# Patient Record
Sex: Female | Born: 1975 | Race: Black or African American | Hispanic: No | Marital: Married | Smoking: Never smoker
Health system: Southern US, Community
[De-identification: ages and names within clinical notes are randomized; demographics above are authoritative.]

## PROBLEM LIST (undated history)

## (undated) DIAGNOSIS — D649 Anemia, unspecified: Secondary | ICD-10-CM

## (undated) DIAGNOSIS — K219 Gastro-esophageal reflux disease without esophagitis: Secondary | ICD-10-CM

## (undated) DIAGNOSIS — F32A Depression, unspecified: Secondary | ICD-10-CM

## (undated) DIAGNOSIS — F4310 Post-traumatic stress disorder, unspecified: Secondary | ICD-10-CM

## (undated) DIAGNOSIS — F329 Major depressive disorder, single episode, unspecified: Secondary | ICD-10-CM

## (undated) DIAGNOSIS — N83202 Unspecified ovarian cyst, left side: Secondary | ICD-10-CM

## (undated) HISTORY — DX: Depression, unspecified: F32.A

## (undated) HISTORY — PX: TUBAL LIGATION: SHX77

## (undated) HISTORY — DX: Major depressive disorder, single episode, unspecified: F32.9

## (undated) HISTORY — DX: Anemia, unspecified: D64.9

---

## 1998-04-23 ENCOUNTER — Other Ambulatory Visit: Admission: RE | Admit: 1998-04-23 | Discharge: 1998-04-23 | Payer: Self-pay | Admitting: Obstetrics and Gynecology

## 1998-09-25 ENCOUNTER — Emergency Department (HOSPITAL_COMMUNITY): Admission: EM | Admit: 1998-09-25 | Discharge: 1998-09-25 | Payer: Self-pay | Admitting: Emergency Medicine

## 1999-03-01 ENCOUNTER — Emergency Department (HOSPITAL_COMMUNITY): Admission: EM | Admit: 1999-03-01 | Discharge: 1999-03-01 | Payer: Self-pay | Admitting: Internal Medicine

## 1999-03-01 ENCOUNTER — Encounter: Payer: Self-pay | Admitting: Internal Medicine

## 1999-03-01 ENCOUNTER — Encounter: Payer: Self-pay | Admitting: Emergency Medicine

## 2000-05-11 ENCOUNTER — Emergency Department (HOSPITAL_COMMUNITY): Admission: EM | Admit: 2000-05-11 | Discharge: 2000-05-11 | Payer: Self-pay | Admitting: Emergency Medicine

## 2000-05-23 ENCOUNTER — Emergency Department (HOSPITAL_COMMUNITY): Admission: EM | Admit: 2000-05-23 | Discharge: 2000-05-23 | Payer: Self-pay | Admitting: *Deleted

## 2000-06-12 ENCOUNTER — Other Ambulatory Visit: Admission: RE | Admit: 2000-06-12 | Discharge: 2000-06-12 | Payer: Self-pay | Admitting: Internal Medicine

## 2006-08-16 ENCOUNTER — Inpatient Hospital Stay (HOSPITAL_COMMUNITY): Admission: AD | Admit: 2006-08-16 | Discharge: 2006-08-16 | Payer: Self-pay | Admitting: Gynecology

## 2006-08-23 ENCOUNTER — Inpatient Hospital Stay (HOSPITAL_COMMUNITY): Admission: AD | Admit: 2006-08-23 | Discharge: 2006-08-23 | Payer: Self-pay | Admitting: Gynecology

## 2006-08-30 ENCOUNTER — Inpatient Hospital Stay (HOSPITAL_COMMUNITY): Admission: AD | Admit: 2006-08-30 | Discharge: 2006-08-30 | Payer: Self-pay | Admitting: Gynecology

## 2006-09-06 ENCOUNTER — Inpatient Hospital Stay (HOSPITAL_COMMUNITY): Admission: AD | Admit: 2006-09-06 | Discharge: 2006-09-06 | Payer: Self-pay | Admitting: Gynecology

## 2007-11-15 HISTORY — PX: TUBAL LIGATION: SHX77

## 2008-05-15 ENCOUNTER — Other Ambulatory Visit: Admission: RE | Admit: 2008-05-15 | Discharge: 2008-05-15 | Payer: Self-pay | Admitting: Obstetrics & Gynecology

## 2008-07-28 ENCOUNTER — Ambulatory Visit (HOSPITAL_BASED_OUTPATIENT_CLINIC_OR_DEPARTMENT_OTHER): Admission: RE | Admit: 2008-07-28 | Discharge: 2008-07-28 | Payer: Self-pay | Admitting: Obstetrics and Gynecology

## 2009-11-18 ENCOUNTER — Emergency Department (HOSPITAL_COMMUNITY): Admission: EM | Admit: 2009-11-18 | Discharge: 2009-11-18 | Payer: Self-pay | Admitting: Emergency Medicine

## 2010-09-16 ENCOUNTER — Ambulatory Visit: Payer: Self-pay | Admitting: Internal Medicine

## 2010-09-30 ENCOUNTER — Ambulatory Visit: Payer: Self-pay | Admitting: Internal Medicine

## 2011-03-29 NOTE — Op Note (Signed)
Kayla Roman, Kayla Roman                ACCOUNT NO.:  [id number]   MEDICAL RECORD NO.:  [id number]          PATIENT TYPE:  AMB   LOCATION:  [facility]                         FACILITY:  [facility]   PHYSICIAN:  Cynthia P. Romine, M.D.DATE OF BIRTH:  01-14-1976   DATE OF PROCEDURE:  07/28/2008  DATE OF DISCHARGE:                               OPERATIVE REPORT   PREOPERATIVE DIAGNOSIS:  Desire for attempt at permanent surgical  sterilization.   POSTOPERATIVE DIAGNOSIS:  Desire for attempt at permanent surgical  sterilization.   PROCEDURE:  Laparoscopic bipolar cautery tubal sterilization procedure  after abandoning the planned Falope ring procedure.   SURGEON:  Cynthia P. Romine, M.D.   ANESTHESIA:  General endotracheal.   ESTIMATED BLOOD LOSS:  Minimal.   COMPLICATIONS:  None.   PROCEDURE:  The patient was taken to the operating room and after the  induction of adequate general endotracheal anesthesia, was placed in the  dorsal lithotomy position and prepped and draped in the usual fashion.  A sterile speculum was inserted into the vagina and a Hulka uterine  manipulator was placed.  The bladder was drained with a red rubber  catheter.  At this point it was pointed out to the surgeon that the  Falope ring applier had been sterilized with the 2 pieces that normally  slide apart together so that they were stuck together and it was  impossible to properly load into the applier apparatus.  There was not a  disposable applier or a non disposable applier in the main OR at [facility], therefore, a runner was sent to [facility] to get their  applier.  While the runner was getting the applier from [facility], the  surgeon proceeded with the laparoscopy.  The subumbilical area was  injected with 0.5% Marcaine with epinephrine and a vertical incision was  made in the fold of the umbilicus.  The Veress needle was inserted in  the peritoneal space.  Proper placement was tested by noting a negative  aspirate and free flow of saline through the Veress needle, again, with  negative aspirate and then by noting the response of a drop of saline  placed at the hub of the Veress needle.  Negative pressure at the  abdominal wall was elevated.  Pneumoperitoneum was then created with 2  liters of CO2 using the automatic insufflator.  A disposable 11-mm  trocar was then inserted into the peritoneal space and its proper  placement noted with the laparoscope.  The pelvis was inspected.  The  uterus contained fibroids as was known preoperatively.  The tubes and  ovaries appeared normal.  The appendix appeared normal.  The upper  abdomen was visualized and also appeared normal.  At this point the  applier had been received from [facility], however, even though it  worked properly, it was already put together when the pack was opened  and therefore it was assumed that it had been sterilized put together  rather than sterilized in pieces and therefore the apparatus was felt to  be sterilized improperly and was therefore not used.  Therefore, in  order to do the tubal sterilization procedure, bipolar cautery was used.  A segment of tube of at least 4 cm was cauterized with bipolar on each  side, identifying the tube and tracing it to its fimbriated end as the  bipolar cautery was done.  Photographic documentation was taken of the  cauterized sites after surgery.  The instruments were removed from the  abdomen and pneumoperitoneum was allowed to escape.  The trocar sleeves  were removed.  The incisions were closed subcuticularly with 3-0 Vicryl Rapide.  The  instruments were removed from the vagina and the procedure was  terminated.  The patient tolerated it well and went in satisfactory  condition to post anesthesia recovery.      Cynthia P. Romine, M.D.  Electronically Signed     CPR/MEDQ  D:  07/28/2008  T:  07/29/2008  Job:  [id number]

## 2011-08-17 LAB — CBC
HCT: 38.3
Hemoglobin: 12.6
MCHC: 32.9
MCV: 87.3
Platelets: 258
RBC: 4.38
RDW: 13.3
WBC: 10.4

## 2011-08-17 LAB — HCG, SERUM, QUALITATIVE: Preg, Serum: NEGATIVE

## 2012-03-07 DIAGNOSIS — Z0000 Encounter for general adult medical examination without abnormal findings: Secondary | ICD-10-CM

## 2012-05-08 ENCOUNTER — Emergency Department (HOSPITAL_COMMUNITY)
Admission: EM | Admit: 2012-05-08 | Discharge: 2012-05-08 | Disposition: A | Discharge status: Home / Self Care | Payer: No Typology Code available for payment source | Attending: Emergency Medicine | Admitting: Emergency Medicine

## 2012-05-08 ENCOUNTER — Encounter (HOSPITAL_COMMUNITY): Payer: Self-pay | Admitting: *Deleted

## 2012-05-08 ENCOUNTER — Emergency Department (HOSPITAL_COMMUNITY): Payer: No Typology Code available for payment source

## 2012-05-08 DIAGNOSIS — S39012A Strain of muscle, fascia and tendon of lower back, initial encounter: Secondary | ICD-10-CM

## 2012-05-08 DIAGNOSIS — M545 Low back pain, unspecified: Secondary | ICD-10-CM | POA: Insufficient documentation

## 2012-05-08 DIAGNOSIS — S161XXA Strain of muscle, fascia and tendon at neck level, initial encounter: Secondary | ICD-10-CM

## 2012-05-08 DIAGNOSIS — M542 Cervicalgia: Secondary | ICD-10-CM | POA: Insufficient documentation

## 2012-05-08 DIAGNOSIS — M25519 Pain in unspecified shoulder: Secondary | ICD-10-CM | POA: Insufficient documentation

## 2012-05-08 DIAGNOSIS — F4310 Post-traumatic stress disorder, unspecified: Secondary | ICD-10-CM | POA: Insufficient documentation

## 2012-05-08 DIAGNOSIS — S335XXA Sprain of ligaments of lumbar spine, initial encounter: Secondary | ICD-10-CM | POA: Insufficient documentation

## 2012-05-08 DIAGNOSIS — S139XXA Sprain of joints and ligaments of unspecified parts of neck, initial encounter: Secondary | ICD-10-CM | POA: Insufficient documentation

## 2012-05-08 HISTORY — DX: Post-traumatic stress disorder, unspecified: F43.10

## 2012-05-08 MED ORDER — CYCLOBENZAPRINE HCL 10 MG PO TABS
10.0000 mg | ORAL_TABLET | Freq: Once | ORAL | Status: AC
  Administered 2012-05-08: 10 mg via ORAL
  Filled 2012-05-08: qty 1

## 2012-05-08 MED ORDER — IBUPROFEN 600 MG PO TABS: 600.0000 mg | ORAL_TABLET | Freq: Four times a day (QID) | ORAL | Status: AC | PRN

## 2012-05-08 MED ORDER — HYDROCODONE-ACETAMINOPHEN 5-325 MG PO TABS
1.0000 | ORAL_TABLET | Freq: Once | ORAL | Status: AC
  Administered 2012-05-08: 1 via ORAL
  Filled 2012-05-08: qty 1

## 2012-05-08 MED ORDER — DIAZEPAM 5 MG PO TABS: 5.0000 mg | ORAL_TABLET | Freq: Three times a day (TID) | ORAL | Status: AC | PRN

## 2012-05-08 MED ORDER — TRAMADOL HCL 50 MG PO TABS: 50.0000 mg | ORAL_TABLET | Freq: Four times a day (QID) | ORAL | Status: AC | PRN

## 2012-05-08 NOTE — ED Notes (Signed)
Patient transported to X-ray 
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
## 2012-05-12 NOTE — ED Provider Notes (Signed)
Medical screening examination/treatment/procedure(s) were performed by non-physician practitioner and as supervising physician I was immediately available for consultation/collaboration.   Christos Mixson E Cincere Zorn, MD 05/12/12 1620 

## 2013-05-18 ENCOUNTER — Emergency Department (HOSPITAL_COMMUNITY)
Admission: EM | Admit: 2013-05-18 | Discharge: 2013-05-18 | Disposition: A | Discharge status: Home / Self Care | Payer: Self-pay | Attending: Emergency Medicine | Admitting: Emergency Medicine

## 2013-05-18 ENCOUNTER — Encounter (HOSPITAL_COMMUNITY): Payer: Self-pay | Admitting: Emergency Medicine

## 2013-05-18 DIAGNOSIS — T7840XA Allergy, unspecified, initial encounter: Secondary | ICD-10-CM

## 2013-05-18 DIAGNOSIS — Y939 Activity, unspecified: Secondary | ICD-10-CM | POA: Insufficient documentation

## 2013-05-18 DIAGNOSIS — Y929 Unspecified place or not applicable: Secondary | ICD-10-CM | POA: Insufficient documentation

## 2013-05-18 DIAGNOSIS — Y999 Unspecified external cause status: Secondary | ICD-10-CM | POA: Insufficient documentation

## 2013-05-18 DIAGNOSIS — R21 Rash and other nonspecific skin eruption: Secondary | ICD-10-CM | POA: Insufficient documentation

## 2013-05-18 MED ORDER — PREDNISONE 20 MG PO TABS: ORAL_TABLET | ORAL | Status: DC

## 2013-05-18 MED ORDER — HYDROCORTISONE 1 % EX CREA: TOPICAL_CREAM | CUTANEOUS | Status: DC

## 2013-05-18 NOTE — ED Provider Notes (Signed)
Medical screening examination/treatment/procedure(s) were performed by non-physician practitioner and as supervising physician I was immediately available for consultation/collaboration.   Harold Moncus L Amai Cappiello, MD 05/18/13 2047 

## 2013-05-18 NOTE — ED Provider Notes (Signed)
History    CSN: [id number] Arrival date & time 05/18/13  0821  First MD Initiated Contact with Patient 05/18/13 0831     Chief Complaint  Patient presents with  . Rash   (Consider location/radiation/quality/duration/timing/severity/associated sxs/prior Treatment) HPI Comments: Patient presents with chief complaint of rash. Patient states that she was staying in a hotel approximately one week ago and she developed bed bug bites. Bites were on her face and forehead, upper extremities. Since that time she has noted worsening swelling of the areas. Areas are itchy and aching. She denies fever. She denies new medications or skin exposures. Patient's friend had similar bites but with a more mild reaction. She's been applying several types of over-the-counter anti-itch cream and calamine lotion with minimal relief. Onset of symptoms gradual. Course is persistent. Nothing makes symptoms better or worse.  The history is provided by the patient.   Past Medical History  Diagnosis Date  . PTSD (post-traumatic stress disorder)    Past Surgical History  Procedure Laterality Date  . Tubal ligation     No family history on file. History  Substance Use Topics  . Smoking status: Never Smoker   . Smokeless tobacco: Never Used  . Alcohol Use: Yes     Comment: occ   OB History   Grav Para Term Preterm Abortions TAB SAB Ect Mult Living                 Review of Systems  Constitutional: Negative for fever.  HENT: Negative for sore throat, facial swelling, rhinorrhea and trouble swallowing.   Eyes: Negative for redness.  Respiratory: Negative for cough, shortness of breath, wheezing and stridor.   Cardiovascular: Negative for chest pain.  Gastrointestinal: Negative for nausea, vomiting, abdominal pain and diarrhea.  Genitourinary: Negative for dysuria.  Musculoskeletal: Negative for myalgias.  Skin: Positive for rash.  Neurological: Negative for light-headedness and headaches.   Psychiatric/Behavioral: Negative for confusion.    Allergies  Review of patient's allergies indicates no known allergies.  Home Medications   Current Outpatient Rx  Name  Route  Sig  Dispense  Refill  . hydrocortisone cream 1 %      Apply to affected area 2 times daily   15 g   0   . predniSONE (DELTASONE) 20 MG tablet      3 Tabs PO Days 1-3, then 2 tabs PO Days 4-6, then 1 tab PO Day 7-9, then Half Tab PO Day 10-12   20 tablet   0    BP 112/74  Pulse 84  Temp(Src) 98.2 F (36.8 C) (Oral)  Resp 18  SpO2 99% Physical Exam  Nursing note and vitals reviewed. Constitutional: She appears well-developed and well-nourished.  HENT:  Head: Normocephalic and atraumatic.  Eyes: Conjunctivae are normal.  Neck: Normal range of motion. Neck supple.  Pulmonary/Chest: No respiratory distress.  Neurological: She is alert.  Skin: Skin is warm and dry.  Several areas on arms, face with 2cm circular areas of erythema c/w bed bug bites. There are actually bullae associated with the bites on the R forearm.   Psychiatric: She has a normal mood and affect.    ED Course  Procedures (including critical care time) Labs Reviewed - No data to display No results found. 1. Allergic reaction, initial encounter    8:41 AM Patient seen and examined.   Vital signs reviewed and are as follows: Filed Vitals:   05/18/13 0833  BP: 112/74  Pulse: 84  Temp: 98.2  F (36.8 C)  Resp: 18   Patient urged to return with worsening symptoms or other concerns. Patient verbalized understanding and agrees with plan.    MDM  Patient with reaction consistent with bedbug bites. It is severe with bullae on R arm. Feel systemic treatment with prednisone indicated given severity. PT does not have contraindications. She appears well, non-toxic. No anaphylaxis.   Renne Crigler, PA-C 05/18/13 [phone]

## 2013-05-18 NOTE — ED Notes (Signed)
Went to hotel last Saturday. Saw bugs in the bed, bites started on Sunday

## 2014-02-03 ENCOUNTER — Ambulatory Visit (INDEPENDENT_AMBULATORY_CARE_PROVIDER_SITE_OTHER): Payer: BC Managed Care – PPO | Admitting: Certified Nurse Midwife

## 2014-02-03 ENCOUNTER — Encounter: Payer: Self-pay | Admitting: Certified Nurse Midwife

## 2014-02-03 VITALS — BP 107/76 | HR 72 | Resp 16 | Ht 63.25 in | Wt 205.0 lb

## 2014-02-03 DIAGNOSIS — Z23 Encounter for immunization: Secondary | ICD-10-CM

## 2014-02-03 DIAGNOSIS — Z01419 Encounter for gynecological examination (general) (routine) without abnormal findings: Secondary | ICD-10-CM

## 2014-02-03 DIAGNOSIS — R319 Hematuria, unspecified: Secondary | ICD-10-CM

## 2014-02-03 DIAGNOSIS — Z0000 Encounter for general adult medical examination without abnormal findings: Secondary | ICD-10-CM

## 2014-02-03 LAB — POCT URINALYSIS DIPSTICK
BILIRUBIN UA: NEGATIVE
GLUCOSE UA: NEGATIVE
KETONES UA: NEGATIVE
Leukocytes, UA: NEGATIVE
Nitrite, UA: NEGATIVE
Protein, UA: NEGATIVE
Urobilinogen, UA: NEGATIVE
pH, UA: 5

## 2014-02-03 NOTE — Progress Notes (Signed)
38 y.o.. Single African American g2 p1011 Fe here to restablish gyn care and for annual exam. No partner change, getting married 05-17-14. Sees PCP for aex, labs, which have been done in the past month. All normal except Vitamin D, which is low on Rx.now. Denies any urinary symptoms today or recent sexual activity or vaginal symptoms. Getting ready for wedding! No other health issues today.    Patient's last menstrual period was 01/23/2014.          Sexually active: yes  The current method of family planning is tubal ligation.    Exercising: no  exercise Smoker:  no  Health Maintenance: Pap:  2011? MMG: none Colonoscopy:  none BMD:   none TDaP:  unsure Labs: Poct urine-rbc-tr Self breast exam: done occ   reports that she has never smoked. She has never used smokeless tobacco. She reports that she drinks about 1.8 ounces of alcohol per week. She reports that she does not use illicit drugs.  Past Medical History  Diagnosis Date  . PTSD (post-traumatic stress disorder)   . Anemia     in the past  . Depression     past    Past Surgical History  Procedure Laterality Date  . Tubal ligation      Current Outpatient Prescriptions  Medication Sig Dispense Refill  . Multiple Vitamins-Minerals (MULTIVITAMIN PO) Take by mouth daily.      . phentermine 30 MG capsule Take 15 mg by mouth every morning.       No current facility-administered medications for this visit.    Family History  Problem Relation Age of Onset  . Cancer Maternal Aunt     kidney  . Diabetes Maternal Aunt   . Hypertension Maternal Aunt   . Cancer Maternal Aunt     pancreatic  . Hypertension Maternal Aunt   . Hypertension Maternal Aunt   . Hypertension Maternal Aunt   . Hypertension Maternal Aunt     ROS:  Pertinent items are noted in HPI.  Otherwise, a comprehensive ROS was negative.  Exam:   BP 107/76  Pulse 72  Resp 16  Ht 5' 3.25" (1.607 m)  Wt 205 lb (92.987 kg)  BMI 36.01 kg/m2  LMP 01/23/2014  Height: 5' 3.25" (160.7 cm)  Ht Readings from Last 3 Encounters:  02/03/14 5' 3.25" (1.607 m)    General appearance: alert, cooperative and appears stated age Head: Normocephalic, without obvious abnormality, atraumatic Neck: no adenopathy, supple, symmetrical, trachea midline and thyroid normal to inspection and palpation and non-palpable Lungs: clear to auscultation bilaterally CVAT: negative bilateral Breasts: normal appearance, no masses or tenderness, No nipple retraction or dimpling, No nipple discharge or bleeding, No axillary or supraclavicular adenopathy Heart: regular rate and rhythm Abdomen: soft, non-tender; no masses,  no organomegaly, negative suprapubic Extremities: extremities normal, atraumatic, no cyanosis or edema Skin: Skin color, texture, turgor normal. No rashes or lesions Lymph nodes: Cervical, supraclavicular, and axillary nodes normal. No abnormal inguinal nodes palpated Neurologic: Grossly normal   Pelvic: External genitalia:  no lesions              Urethra:  normal appearing urethra with no masses, tenderness or lesions, bladder and urethra meatus non tender              Bartholin's and Skene's: normal                 Vagina: normal appearing vagina with normal color and discharge, no lesions  Cervix: normal,non tender              Pap taken: yes Bimanual Exam:  Uterus:  normal size, contour, position, consistency, mobility, non-tender and anteverted              Adnexa: normal adnexa and no mass, fullness, tenderness               Rectovaginal: Confirms               Anus:  normal sphincter tone, no lesions  A:  Well Woman with normal exam  Contraception BTL  Immunization up date  R/O UTI RBC trace  Vitamin D deficiency under follow up with PCP  P:   Reviewed health and wellness pertinent to exam  Patient requests TDAP  Lab: Urine micro/culture  Continue follow up as indicated  Pap smear as per guidelines   Mammogram at 41 pap smear  taken today with HPVHR  counseled on breast self exam, adequate intake of calcium and vitamin D, diet and exercise Wished well with upcoming marriage. return annually or prn  An After Visit Summary was printed and given to the patient.

## 2014-02-03 NOTE — Patient Instructions (Signed)
EXERCISE AND DIET:  We recommended that you start or continue a regular exercise program for good health. Regular exercise means any activity that makes your heart beat faster and makes you sweat.  We recommend exercising at least 30 minutes per day at least 3 days a week, preferably 4 or 5.  We also recommend a diet low in fat and sugar.  Inactivity, poor dietary choices and obesity can cause diabetes, heart attack, stroke, and kidney damage, among others.    ALCOHOL AND SMOKING:  Women should limit their alcohol intake to no more than 7 drinks/beers/glasses of wine (combined, not each!) per week. Moderation of alcohol intake to this level decreases your risk of breast cancer and liver damage. And of course, no recreational drugs are part of a healthy lifestyle.  And absolutely no smoking or even second hand smoke. Most people know smoking can cause heart and lung diseases, but did you know it also contributes to weakening of your bones? Aging of your skin?  Yellowing of your teeth and nails?  CALCIUM AND VITAMIN D:  Adequate intake of calcium and Vitamin D are recommended.  The recommendations for exact amounts of these supplements seem to change often, but generally speaking 600 mg of calcium (either carbonate or citrate) and 800 units of Vitamin D per day seems prudent. Certain women may benefit from higher intake of Vitamin D.  If you are among these women, your doctor will have told you during your visit.    PAP SMEARS:  Pap smears, to check for cervical cancer or precancers,  have traditionally been done yearly, although recent scientific advances have shown that most women can have pap smears less often.  However, every woman still should have a physical exam from her gynecologist every year. It will include a breast check, inspection of the vulva and vagina to check for abnormal growths or skin changes, a visual exam of the cervix, and then an exam to evaluate the size and shape of the uterus and  ovaries.  And after 37 years of age, a rectal exam is indicated to check for rectal cancers. We will also provide age appropriate advice regarding health maintenance, like when you should have certain vaccines, screening for sexually transmitted diseases, bone density testing, colonoscopy, mammograms, etc.   MAMMOGRAMS:  All women over 40 years old should have a yearly mammogram. Many facilities now offer a "3D" mammogram, which may cost around $50 extra out of pocket. If possible,  we recommend you accept the option to have the 3D mammogram performed.  It both reduces the number of women who will be called back for extra views which then turn out to be normal, and it is better than the routine mammogram at detecting truly abnormal areas.    COLONOSCOPY:  Colonoscopy to screen for colon cancer is recommended for all women at age 50.  We know, you hate the idea of the prep.  We agree, BUT, having colon cancer and not knowing it is worse!!  Colon cancer so often starts as a polyp that can be seen and removed at colonscopy, which can quite literally save your life!  And if your first colonoscopy is normal and you have no family history of colon cancer, most women don't have to have it again for 10 years.  Once every ten years, you can do something that may end up saving your life, right?  We will be happy to help you get it scheduled when you are ready.    Be sure to check your insurance coverage so you understand how much it will cost.  It may be covered as a preventative service at no cost, but you should check your particular policy.     Tetanus, Diphtheria, Pertussis (Tdap) Vaccine What You Need to Know WHY GET VACCINATED? Tetanus, diphtheria and pertussis can be very serious diseases, even for adolescents and adults. Tdap vaccine can protect us from these diseases. TETANUS (Lockjaw) causes painful muscle tightening and stiffness, usually all over the body.  It can lead to tightening of muscles in the  head and neck so you can't open your mouth, swallow, or sometimes even breathe. Tetanus kills about 1 out of 5 people who are infected. DIPHTHERIA can cause a thick coating to form in the back of the throat.  It can lead to breathing problems, paralysis, heart failure, and death. PERTUSSIS (Whooping Cough) causes severe coughing spells, which can cause difficulty breathing, vomiting and disturbed sleep.  It can also lead to weight loss, incontinence, and rib fractures. Up to 2 in 100 adolescents and 5 in 100 adults with pertussis are hospitalized or have complications, which could include pneumonia and death. These diseases are caused by bacteria. Diphtheria and pertussis are spread from person to person through coughing or sneezing. Tetanus enters the body through cuts, scratches, or wounds. Before vaccines, the [place] saw as many as 200,000 cases a year of diphtheria and pertussis, and hundreds of cases of tetanus. Since vaccination began, tetanus and diphtheria have dropped by about 99% and pertussis by about 80%. TDAP VACCINE Tdap vaccine can protect adolescents and adults from tetanus, diphtheria, and pertussis. One dose of Tdap is routinely given at age 11 or 12. People who did not get Tdap at that age should get it as soon as possible. Tdap is especially important for health care professionals and anyone having close contact with a baby younger than 12 months. Pregnant women should get a dose of Tdap during every pregnancy, to protect the newborn from pertussis. Infants are most at risk for severe, life-threatening complications from pertussis. A similar vaccine, called Td, protects from tetanus and diphtheria, but not pertussis. A Td booster should be given every 10 years. Tdap may be given as one of these boosters if you have not already gotten a dose. Tdap may also be given after a severe cut or burn to prevent tetanus infection. Your doctor can give you more information. Tdap may  safely be given at the same time as other vaccines. SOME PEOPLE SHOULD NOT GET THIS VACCINE  If you ever had a life-threatening allergic reaction after a dose of any tetanus, diphtheria, or pertussis containing vaccine, OR if you have a severe allergy to any part of this vaccine, you should not get Tdap. Tell your doctor if you have any severe allergies.  If you had a coma, or long or multiple seizures within 7 days after a childhood dose of DTP or DTaP, you should not get Tdap, unless a cause other than the vaccine was found. You can still get Td.  Talk to your doctor if you:  have epilepsy or another nervous system problem,  had severe pain or swelling after any vaccine containing diphtheria, tetanus or pertussis,  ever had Guillain-Barr Syndrome (GBS),  aren't feeling well on the day the shot is scheduled. RISKS OF A VACCINE REACTION With any medicine, including vaccines, there is a chance of side effects. These are usually mild and go away on their own, but   serious reactions are also possible. Brief fainting spells can follow a vaccination, leading to injuries from falling. Sitting or lying down for about 15 minutes can help prevent these. Tell your doctor if you feel dizzy or light-headed, or have vision changes or ringing in the ears. Mild problems following Tdap (Did not interfere with activities)  Pain where the shot was given (about 3 in 4 adolescents or 2 in 3 adults)  Redness or swelling where the shot was given (about 1 person in 5)  Mild fever of at least 100.4F (up to about 1 in 25 adolescents or 1 in 100 adults)  Headache (about 3 or 4 people in 10)  Tiredness (about 1 person in 3 or 4)  Nausea, vomiting, diarrhea, stomach ache (up to 1 in 4 adolescents or 1 in 10 adults)  Chills, body aches, sore joints, rash, swollen glands (uncommon) Moderate problems following Tdap (Interfered with activities, but did not require medical attention)  Pain where the shot was  given (about 1 in 5 adolescents or 1 in 100 adults)  Redness or swelling where the shot was given (up to about 1 in 16 adolescents or 1 in 25 adults)  Fever over 102F (about 1 in 100 adolescents or 1 in 250 adults)  Headache (about 3 in 20 adolescents or 1 in 10 adults)  Nausea, vomiting, diarrhea, stomach ache (up to 1 or 3 people in 100)  Swelling of the entire arm where the shot was given (up to about 3 in 100). Severe problems following Tdap (Unable to perform usual activities, required medical attention)  Swelling, severe pain, bleeding and redness in the arm where the shot was given (rare). A severe allergic reaction could occur after any vaccine (estimated less than 1 in a million doses). WHAT IF THERE IS A SERIOUS REACTION? What should I look for?  Look for anything that concerns you, such as signs of a severe allergic reaction, very high fever, or behavior changes. Signs of a severe allergic reaction can include hives, swelling of the face and throat, difficulty breathing, a fast heartbeat, dizziness, and weakness. These would start a few minutes to a few hours after the vaccination. What should I do?  If you think it is a severe allergic reaction or other emergency that can't wait, call 9-1-1 or get the person to the nearest hospital. Otherwise, call your doctor.  Afterward, the reaction should be reported to the "Vaccine Adverse Event Reporting System" (VAERS). Your doctor might file this report, or you can do it yourself through the VAERS web site at www.vaers.hhs.gov, or by calling 1-800-822-7967. VAERS is only for reporting reactions. They do not give medical advice.  THE NATIONAL VACCINE INJURY COMPENSATION PROGRAM The National Vaccine Injury Compensation Program (VICP) is a federal program that was created to compensate people who may have been injured by certain vaccines. Persons who believe they may have been injured by a vaccine can learn about the program and about  filing a claim by calling 1-800-338-2382 or visiting the VICP website at www.hrsa.gov/vaccinecompensation. HOW CAN I LEARN MORE?  Ask your doctor.  Call your local or state health department.  Contact the Centers for Disease Control and Prevention (CDC):  Call 1-800-232-4636 or visit CDC's website at www.cdc.gov/vaccines. CDC Tdap Vaccine VIS (03/22/12) Document Released: 05/01/2012 Document Revised: 02/25/2013 Document Reviewed: 02/20/2013 [employer] Patient Information 2014 [employer].  

## 2014-02-04 LAB — URINALYSIS, MICROSCOPIC ONLY
Bacteria, UA: NONE SEEN
CASTS: NONE SEEN
CRYSTALS: NONE SEEN

## 2014-02-04 LAB — URINE CULTURE
COLONY COUNT: NO GROWTH
ORGANISM ID, BACTERIA: NO GROWTH

## 2014-02-04 NOTE — Progress Notes (Signed)
Reviewed personally.  M. Suzanne Aslynn Brunetti, MD.  

## 2014-02-05 LAB — IPS PAP TEST WITH HPV

## 2014-08-11 ENCOUNTER — Telehealth: Payer: Self-pay | Admitting: Certified Nurse Midwife

## 2014-08-11 DIAGNOSIS — Z319 Encounter for procreative management, unspecified: Secondary | ICD-10-CM

## 2014-08-11 DIAGNOSIS — Z9851 Tubal ligation status: Secondary | ICD-10-CM

## 2014-08-11 NOTE — Telephone Encounter (Signed)
Yes go ahead and refer.

## 2014-08-11 NOTE — Telephone Encounter (Signed)
Patient calling wanting to speak with nurse to see if we can help her with questions she has about "reversing a tubal ligation." She will be completing a release of records form requesting records from that surgery. Please advise.

## 2014-08-11 NOTE — Telephone Encounter (Signed)
Kayla Roman,  Patient had laparoscopic bipolar cautery tubal sterilization done by Dr. Tresa Res on 07/28/2008. Patient would like to discuss future childbearing with a provider.  Advised typically referred to Dr. April Manson. Okay to refer to Dr. April Manson?

## 2014-08-15 NOTE — Telephone Encounter (Signed)
Patient returned call. Advised referral placed for Dr. April MansonYalcinkaya. Number of office given to patient. Advised to call if does not hear from the office.

## 2014-08-15 NOTE — Telephone Encounter (Signed)
Referral has been sent to Dr. Lyndal RainbowYalcinkaya's office.   Message left to return call to [place] at [phone] to advise.   Left message at Dr. Lyndal RainbowYalcinkaya's office to ensure referral received.

## 2014-08-21 NOTE — Telephone Encounter (Signed)
Called Dr. Adela GlimpseYalcikaya's office. They have received referral and reached out to paitent. They will advise of appointment when made.  Routing to provider for final review. Patient agreeable to disposition. Will close encounter

## 2014-09-15 ENCOUNTER — Encounter: Payer: Self-pay | Admitting: Certified Nurse Midwife

## 2014-10-16 ENCOUNTER — Telehealth: Payer: Self-pay | Admitting: Certified Nurse Midwife

## 2014-10-16 NOTE — Telephone Encounter (Signed)
Patient has an appointment 11/21/14 @1 :30 with Dr. Eugenio HoesYalcinyaka.

## 2015-01-13 HISTORY — PX: MICROTUBOPLASTY: SHX5401

## 2015-07-30 ENCOUNTER — Other Ambulatory Visit: Payer: Self-pay | Admitting: Gastroenterology

## 2015-07-30 DIAGNOSIS — R7989 Other specified abnormal findings of blood chemistry: Secondary | ICD-10-CM

## 2015-07-30 DIAGNOSIS — R945 Abnormal results of liver function studies: Principal | ICD-10-CM

## 2015-08-03 ENCOUNTER — Ambulatory Visit
Admission: RE | Admit: 2015-08-03 | Discharge: 2015-08-03 | Disposition: A | Discharge status: Home / Self Care | Payer: BLUE CROSS/BLUE SHIELD | Source: Ambulatory Visit | Attending: Gastroenterology | Admitting: Gastroenterology

## 2015-08-03 DIAGNOSIS — R7989 Other specified abnormal findings of blood chemistry: Secondary | ICD-10-CM

## 2015-08-03 DIAGNOSIS — R945 Abnormal results of liver function studies: Principal | ICD-10-CM

## 2015-12-21 ENCOUNTER — Encounter (HOSPITAL_BASED_OUTPATIENT_CLINIC_OR_DEPARTMENT_OTHER): Payer: Self-pay | Admitting: *Deleted

## 2015-12-21 NOTE — Progress Notes (Signed)
NPO AFTER MN.  ARRIVE AT 1115.  NEEDS HG AND URINE PREG.

## 2015-12-28 NOTE — H&P (Signed)
Amy Gross is a 40 y.o. female , originally referred to me by Dr. Melvia Heaps, for ovarian cysts.  She was diagnosed with a 2.8 cm heterogeneously echogenic area, consistent with a right dermoid cyst. Patient would like to preserve her childbearing potential.  Pertinent Gynecological History: Menses: Normal Bleeding: Normal Contraception: none DES exposure: denies Blood transfusions: none Sexually transmitted diseases: no past history Previous GYN Procedures: Tubal Ligation followed by reanastomosis.  Last mammogram: normal Last pap: normal  OB History: G3, P1, SAB1, TOP1   Menstrual History: Menarche age: 61 No LMP recorded.    Past Medical History  Diagnosis Date  . Left ovarian cyst   . GERD (gastroesophageal reflux disease)                     Past Surgical History  Procedure Laterality Date  . Tubal ligation  2009  . Microtuboplasty Bilateral March 2016             History reviewed. No pertinent family history. No hereditary disease.  No cancer of breast, ovary, uterus. No cutaneous leiomyomatosis or renal cell carcinoma.  Social History   Social History  . Marital Status: Married    Spouse Name: N/A  . Number of Children: N/A  . Years of Education: N/A   Occupational History  . Not on file.   Social History Main Topics  . Smoking status: Never Smoker   . Smokeless tobacco: Never Used  . Alcohol Use: Yes     Comment: occasional  . Drug Use: No  . Sexual Activity: Not on file   Other Topics Concern  . Not on file   Social History Narrative  . No narrative on file    No Known Allergies  No current facility-administered medications on file prior to encounter.   No current outpatient prescriptions on file prior to encounter.     Review of Systems  Constitutional: Negative.   HENT: Negative.   Eyes: Negative.   Respiratory: Negative.   Cardiovascular: Negative.   Gastrointestinal: Negative.   Genitourinary: Negative.    Musculoskeletal: Negative.   Skin: Negative.   Neurological: Negative.   Endo/Heme/Allergies: Negative.   Psychiatric/Behavioral: Negative.      Physical Exam  Ht 5\' 2"  (1.575 m)  Wt 102.967 kg (227 lb)  BMI 41.51 kg/m2  LMP 12/01/2015 (Exact Date) Constitutional: She is oriented to person, place, and time. She appears well-developed and well-nourished.  HENT:  Head: Normocephalic and atraumatic.  Nose: Nose normal.  Mouth/Throat: Oropharynx is clear and moist. No oropharyngeal exudate.  Eyes: Conjunctivae normal and EOM are normal. Pupils are equal, round, and reactive to light. No scleral icterus.  Neck: Normal range of motion. Neck supple. No tracheal deviation present. No thyromegaly present.  Cardiovascular: Normal rate.   Respiratory: Effort normal and breath sounds normal.  GI: Soft. Bowel sounds are normal. She exhibits no distension and no mass. There is no tenderness.  Lymphadenopathy:    She has no cervical adenopathy.  Neurological: She is alert and oriented to person, place, and time. She has normal reflexes.  Skin: Skin is warm.  Psychiatric: She has a normal mood and affect. Her behavior is normal. Judgment and thought content normal.       Assessment/Plan:  I discussed with patient at length the options: Expectancy versus laparoscopy and right ovarian cystectomy. It is very likely that the patient will need at least ovulation induction, more likely controlled ovarian stimulation and IVF/ET as the next  step, since she has not conceived through the patent right tubal reanastomosis.  Because of her advanced reproductive age, she would like to proceed with laparoscopic cystectomy.

## 2015-12-29 ENCOUNTER — Ambulatory Visit (HOSPITAL_BASED_OUTPATIENT_CLINIC_OR_DEPARTMENT_OTHER)
Admission: RE | Admit: 2015-12-29 | Discharge: 2015-12-29 | Disposition: A | Discharge status: Home / Self Care | Payer: BLUE CROSS/BLUE SHIELD | Source: Ambulatory Visit | Attending: Obstetrics and Gynecology | Admitting: Obstetrics and Gynecology

## 2015-12-29 ENCOUNTER — Encounter (HOSPITAL_BASED_OUTPATIENT_CLINIC_OR_DEPARTMENT_OTHER)
Admission: RE | Disposition: A | Discharge status: Home / Self Care | Payer: Self-pay | Source: Ambulatory Visit | Attending: Obstetrics and Gynecology

## 2015-12-29 ENCOUNTER — Ambulatory Visit (HOSPITAL_BASED_OUTPATIENT_CLINIC_OR_DEPARTMENT_OTHER): Payer: BLUE CROSS/BLUE SHIELD | Admitting: Anesthesiology

## 2015-12-29 ENCOUNTER — Encounter (HOSPITAL_BASED_OUTPATIENT_CLINIC_OR_DEPARTMENT_OTHER): Payer: Self-pay | Admitting: *Deleted

## 2015-12-29 DIAGNOSIS — D270 Benign neoplasm of right ovary: Secondary | ICD-10-CM | POA: Insufficient documentation

## 2015-12-29 DIAGNOSIS — K219 Gastro-esophageal reflux disease without esophagitis: Secondary | ICD-10-CM | POA: Insufficient documentation

## 2015-12-29 DIAGNOSIS — N803 Endometriosis of pelvic peritoneum: Secondary | ICD-10-CM | POA: Diagnosis not present

## 2015-12-29 DIAGNOSIS — N736 Female pelvic peritoneal adhesions (postinfective): Secondary | ICD-10-CM | POA: Insufficient documentation

## 2015-12-29 DIAGNOSIS — Z6841 Body Mass Index (BMI) 40.0 and over, adult: Secondary | ICD-10-CM | POA: Insufficient documentation

## 2015-12-29 HISTORY — DX: Gastro-esophageal reflux disease without esophagitis: K21.9

## 2015-12-29 HISTORY — DX: Unspecified ovarian cyst, left side: N83.202

## 2015-12-29 HISTORY — PX: LAPAROSCOPIC OVARIAN CYSTECTOMY: SHX6248

## 2015-12-29 LAB — POCT HEMOGLOBIN-HEMACUE: Hemoglobin: 13.3 g/dL (ref 12.0–15.0)

## 2015-12-29 LAB — POCT PREGNANCY, URINE: Preg Test, Ur: NEGATIVE

## 2015-12-29 SURGERY — EXCISION, CYST, OVARY, LAPAROSCOPIC
Anesthesia: General | Laterality: Right

## 2015-12-29 MED ORDER — LIDOCAINE HCL (CARDIAC) 20 MG/ML IV SOLN
INTRAVENOUS | Status: AC
  Filled 2015-12-29: qty 5

## 2015-12-29 MED ORDER — NEOSTIGMINE METHYLSULFATE 10 MG/10ML IV SOLN
INTRAVENOUS | Status: AC
  Filled 2015-12-29: qty 1

## 2015-12-29 MED ORDER — CEFAZOLIN SODIUM-DEXTROSE 2-3 GM-% IV SOLR
2.0000 g | INTRAVENOUS | Status: AC
  Administered 2015-12-29: 2 g via INTRAVENOUS
  Filled 2015-12-29: qty 50

## 2015-12-29 MED ORDER — LIDOCAINE HCL (CARDIAC) 20 MG/ML IV SOLN
INTRAVENOUS | Status: DC | PRN
  Administered 2015-12-29: 30 mg via INTRAVENOUS

## 2015-12-29 MED ORDER — BUPIVACAINE-EPINEPHRINE 0.5% -1:200000 IJ SOLN
INTRAMUSCULAR | Status: DC | PRN
  Administered 2015-12-29: 8 mL

## 2015-12-29 MED ORDER — SUCCINYLCHOLINE CHLORIDE 20 MG/ML IJ SOLN
INTRAMUSCULAR | Status: DC | PRN
  Administered 2015-12-29: 100 mg via INTRAVENOUS

## 2015-12-29 MED ORDER — GLYCOPYRROLATE 0.2 MG/ML IJ SOLN
INTRAMUSCULAR | Status: DC | PRN
  Administered 2015-12-29: 0.6 mg via INTRAVENOUS

## 2015-12-29 MED ORDER — GLYCOPYRROLATE 0.2 MG/ML IJ SOLN
INTRAMUSCULAR | Status: AC
  Filled 2015-12-29: qty 3

## 2015-12-29 MED ORDER — PROMETHAZINE HCL 25 MG/ML IJ SOLN
6.2500 mg | INTRAMUSCULAR | Status: DC | PRN
  Filled 2015-12-29: qty 1

## 2015-12-29 MED ORDER — ACETAMINOPHEN 325 MG PO TABS
325.0000 mg | ORAL_TABLET | ORAL | Status: DC | PRN
  Filled 2015-12-29: qty 2

## 2015-12-29 MED ORDER — ONDANSETRON HCL 4 MG/2ML IJ SOLN
INTRAMUSCULAR | Status: AC
  Filled 2015-12-29: qty 2

## 2015-12-29 MED ORDER — KETOROLAC TROMETHAMINE 30 MG/ML IJ SOLN
INTRAMUSCULAR | Status: AC
  Filled 2015-12-29: qty 1

## 2015-12-29 MED ORDER — PROPOFOL 10 MG/ML IV BOLUS
INTRAVENOUS | Status: DC | PRN
  Administered 2015-12-29: 200 mg via INTRAVENOUS

## 2015-12-29 MED ORDER — PROPOFOL 10 MG/ML IV BOLUS
INTRAVENOUS | Status: AC
Start: 2015-12-29 — End: 2015-12-29
  Filled 2015-12-29: qty 20

## 2015-12-29 MED ORDER — MIDAZOLAM HCL 5 MG/5ML IJ SOLN
INTRAMUSCULAR | Status: DC | PRN
  Administered 2015-12-29: 2 mg via INTRAVENOUS

## 2015-12-29 MED ORDER — OXYCODONE HCL 5 MG/5ML PO SOLN
5.0000 mg | Freq: Once | ORAL | Status: DC | PRN
  Filled 2015-12-29: qty 5

## 2015-12-29 MED ORDER — OXYCODONE HCL 5 MG PO TABS
5.0000 mg | ORAL_TABLET | Freq: Once | ORAL | Status: DC | PRN
  Filled 2015-12-29: qty 1

## 2015-12-29 MED ORDER — LIDOCAINE HCL 4 % EX SOLN
CUTANEOUS | Status: DC | PRN
  Administered 2015-12-29: 2 mL via TOPICAL

## 2015-12-29 MED ORDER — DEXAMETHASONE SODIUM PHOSPHATE 10 MG/ML IJ SOLN
INTRAMUSCULAR | Status: AC
Start: 2015-12-29 — End: 2015-12-29
  Filled 2015-12-29: qty 1

## 2015-12-29 MED ORDER — FENTANYL CITRATE (PF) 100 MCG/2ML IJ SOLN
INTRAMUSCULAR | Status: DC | PRN
  Administered 2015-12-29 (×4): 50 ug via INTRAVENOUS

## 2015-12-29 MED ORDER — NEOSTIGMINE METHYLSULFATE 10 MG/10ML IV SOLN
INTRAVENOUS | Status: DC | PRN
  Administered 2015-12-29: 4 mg via INTRAVENOUS

## 2015-12-29 MED ORDER — LACTATED RINGERS IV SOLN
INTRAVENOUS | Status: DC
  Administered 2015-12-29 (×3): via INTRAVENOUS
  Filled 2015-12-29: qty 1000

## 2015-12-29 MED ORDER — OXYCODONE-ACETAMINOPHEN 5-325 MG PO TABS
1.0000 | ORAL_TABLET | ORAL | Status: AC | PRN
  Administered 2015-12-29: 1 via ORAL
  Filled 2015-12-29: qty 2

## 2015-12-29 MED ORDER — DEXAMETHASONE SODIUM PHOSPHATE 4 MG/ML IJ SOLN
INTRAMUSCULAR | Status: DC | PRN
  Administered 2015-12-29: 10 mg via INTRAVENOUS

## 2015-12-29 MED ORDER — FENTANYL CITRATE (PF) 250 MCG/5ML IJ SOLN
INTRAMUSCULAR | Status: AC
  Filled 2015-12-29: qty 5

## 2015-12-29 MED ORDER — ONDANSETRON HCL 4 MG PO TABS: 4.0000 mg | ORAL_TABLET | Freq: Three times a day (TID) | ORAL | Status: DC | PRN

## 2015-12-29 MED ORDER — ACETAMINOPHEN 160 MG/5ML PO SOLN
325.0000 mg | ORAL | Status: DC | PRN
  Filled 2015-12-29: qty 20.3

## 2015-12-29 MED ORDER — ONDANSETRON HCL 4 MG/2ML IJ SOLN
INTRAMUSCULAR | Status: DC | PRN
  Administered 2015-12-29: 4 mg via INTRAVENOUS

## 2015-12-29 MED ORDER — KETOROLAC TROMETHAMINE 30 MG/ML IJ SOLN
INTRAMUSCULAR | Status: DC | PRN
  Administered 2015-12-29: 30 mg via INTRAVENOUS

## 2015-12-29 MED ORDER — ROCURONIUM BROMIDE 100 MG/10ML IV SOLN
INTRAVENOUS | Status: DC | PRN
  Administered 2015-12-29: 10 mg via INTRAVENOUS
  Administered 2015-12-29: 5 mg via INTRAVENOUS
  Administered 2015-12-29: 25 mg via INTRAVENOUS
  Administered 2015-12-29: 10 mg via INTRAVENOUS

## 2015-12-29 MED ORDER — MIDAZOLAM HCL 2 MG/2ML IJ SOLN
INTRAMUSCULAR | Status: AC
  Filled 2015-12-29: qty 2

## 2015-12-29 MED ORDER — ARTIFICIAL TEARS OP OINT
TOPICAL_OINTMENT | OPHTHALMIC | Status: AC
  Filled 2015-12-29: qty 3.5

## 2015-12-29 MED ORDER — OXYCODONE-ACETAMINOPHEN 7.5-325 MG PO TABS: 1.0000 | ORAL_TABLET | ORAL | Status: DC | PRN

## 2015-12-29 MED ORDER — CEFAZOLIN SODIUM-DEXTROSE 2-3 GM-% IV SOLR
INTRAVENOUS | Status: AC
  Filled 2015-12-29: qty 50

## 2015-12-29 MED ORDER — ONDANSETRON HCL 4 MG/2ML IJ SOLN
4.0000 mg | Freq: Once | INTRAMUSCULAR | Status: AC
  Administered 2015-12-29: 4 mg via INTRAVENOUS
  Filled 2015-12-29: qty 2

## 2015-12-29 MED ORDER — FENTANYL CITRATE (PF) 100 MCG/2ML IJ SOLN
INTRAMUSCULAR | Status: AC
  Filled 2015-12-29: qty 2

## 2015-12-29 MED ORDER — OXYCODONE-ACETAMINOPHEN 5-325 MG PO TABS
ORAL_TABLET | ORAL | Status: AC
  Filled 2015-12-29: qty 1

## 2015-12-29 MED ORDER — FENTANYL CITRATE (PF) 100 MCG/2ML IJ SOLN
25.0000 ug | INTRAMUSCULAR | Status: DC | PRN
  Administered 2015-12-29 (×2): 25 ug via INTRAVENOUS
  Filled 2015-12-29: qty 1

## 2015-12-29 MED ORDER — LACTATED RINGERS IR SOLN
Status: DC | PRN
  Administered 2015-12-29: 3000 mL

## 2015-12-29 MED FILL — ONDANSETRON HCL 4 MG TABLET: 4 | 5 days supply | Qty: 15 | Fill #0

## 2015-12-29 MED FILL — OXYCODONE/APAP 7.5/325MG: 7.5-325 | 2 days supply | Qty: 15 | Fill #0

## 2015-12-29 SURGICAL SUPPLY — 60 items
APPLICATOR COTTON TIP 6IN STRL (MISCELLANEOUS) ×3 IMPLANT
BAG SPEC RTRVL LRG 6X4 10 (ENDOMECHANICALS)
BARRIER ADHS 3X4 INTERCEED (GAUZE/BANDAGES/DRESSINGS) ×3 IMPLANT
BLADE SURG 11 STRL SS (BLADE) ×3 IMPLANT
BRR ADH 4X3 ABS CNTRL BYND (GAUZE/BANDAGES/DRESSINGS) ×1
BRR ADH 6X5 SEPRAFILM 1 SHT (MISCELLANEOUS)
COVER MAYO STAND STRL (DRAPES) ×3 IMPLANT
DEVICE TROCAR PUNCTURE CLOSURE (ENDOMECHANICALS) IMPLANT
DRAPE UNDERBUTTOCKS STRL (DRAPE) ×3 IMPLANT
DRSG OPSITE POSTOP 3X4 (GAUZE/BANDAGES/DRESSINGS) IMPLANT
ELECT NEEDLE TIP 2.8 STRL (NEEDLE) IMPLANT
ELECT REM PT RETURN 9FT ADLT (ELECTROSURGICAL) ×3
ELECTRODE REM PT RTRN 9FT ADLT (ELECTROSURGICAL) ×1 IMPLANT
GLOVE BIOGEL PI IND STRL 8.5 (GLOVE) ×1 IMPLANT
GLOVE BIOGEL PI INDICATOR 8.5 (GLOVE) ×2
GOWN STRL REUS W/ TWL LRG LVL3 (GOWN DISPOSABLE) ×2 IMPLANT
GOWN STRL REUS W/TWL LRG LVL3 (GOWN DISPOSABLE) ×6
INZII UNIVERSAL RETRIEVAL SYSTEM ×3 IMPLANT
KIT ROOM TURNOVER WOR (KITS) ×3 IMPLANT
MANIPULATOR UTERINE 4.5 ZUMI (MISCELLANEOUS) ×3 IMPLANT
NEEDLE HYPO 25X1 1.5 SAFETY (NEEDLE) ×3 IMPLANT
NEEDLE INSUFFLATION 14GA 120MM (NEEDLE) ×3 IMPLANT
NS IRRIG 500ML POUR BTL (IV SOLUTION) ×3 IMPLANT
PACK BASIN DAY SURGERY FS (CUSTOM PROCEDURE TRAY) ×3 IMPLANT
PACK LAPAROSCOPY II (CUSTOM PROCEDURE TRAY) ×3 IMPLANT
PAD OB MATERNITY 4.3X12.25 (PERSONAL CARE ITEMS) ×3 IMPLANT
PADDING ION DISPOSABLE (MISCELLANEOUS) ×3 IMPLANT
PENCIL BUTTON HOLSTER BLD 10FT (ELECTRODE) IMPLANT
POUCH SPECIMEN RETRIEVAL 10MM (ENDOMECHANICALS) IMPLANT
SEALER TISSUE G2 CVD JAW 35 (ENDOMECHANICALS) IMPLANT
SEALER TISSUE G2 CVD JAW 45CM (ENDOMECHANICALS) IMPLANT
SEPRAFILM MEMBRANE 5X6 (MISCELLANEOUS) IMPLANT
SET IRRIG TUBING LAPAROSCOPIC (IRRIGATION / IRRIGATOR) ×3 IMPLANT
SHEARS HARMONIC ACE PLUS 36CM (ENDOMECHANICALS) ×3 IMPLANT
SOLUTION ANTI FOG 6CC (MISCELLANEOUS) ×3 IMPLANT
SUT MNCRL AB 4-0 PS2 18 (SUTURE) ×3 IMPLANT
SUT PROLENE 0 CT 1 30 (SUTURE) IMPLANT
SUT VIC AB 2-0 CT1 27 (SUTURE)
SUT VIC AB 2-0 CT1 TAPERPNT 27 (SUTURE) IMPLANT
SUT VIC AB 2-0 CT2 27 (SUTURE) IMPLANT
SUT VIC AB 2-0 UR6 27 (SUTURE) IMPLANT
SUT VIC AB 4-0 SH 27 (SUTURE)
SUT VIC AB 4-0 SH 27XANBCTRL (SUTURE) IMPLANT
SUT VICRYL 0 TIES 12 18 (SUTURE) IMPLANT
SYR 20CC LL (SYRINGE) IMPLANT
SYR 30ML LL (SYRINGE) ×3 IMPLANT
SYR 3ML 23GX1 SAFETY (SYRINGE) IMPLANT
SYR 5ML LL (SYRINGE) ×3 IMPLANT
SYR CONTROL 10ML LL (SYRINGE) ×3 IMPLANT
SYRINGE 10CC LL (SYRINGE) ×3 IMPLANT
TOWEL OR 17X24 6PK STRL BLUE (TOWEL DISPOSABLE) ×6 IMPLANT
TRAY DSU PREP LF (CUSTOM PROCEDURE TRAY) ×3 IMPLANT
TRAY FOLEY CATH SILVER 14FR (SET/KITS/TRAYS/PACK) ×3 IMPLANT
TROCAR OPTI TIP 5M 100M (ENDOMECHANICALS) ×3 IMPLANT
TROCAR XCEL DIL TIP R 11M (ENDOMECHANICALS) IMPLANT
TUBE CONNECTING 12'X1/4 (SUCTIONS)
TUBE CONNECTING 12X1/4 (SUCTIONS) IMPLANT
TUBING INSUFFLATION 10FT LAP (TUBING) IMPLANT
WARMER LAPAROSCOPE (MISCELLANEOUS) ×3 IMPLANT
WATER STERILE IRR 500ML POUR (IV SOLUTION) ×3 IMPLANT

## 2015-12-29 NOTE — Op Note (Signed)
Operative Note  Preoperative diagnosis: Right ovarian dermoid cyst  Postoperative diagnosis: Right ovarian dermoid cyst, abdominal and pelvic adhesions, stage I endometriosis of pelvic peritoneum  Procedure: Laparoscopy, lysis of adhesions, right ovarian cystectomy, electrosurgical excision and ablation of pelvic endometriosis, chromotubation  Anesthesia: Gen. endotracheal  Complications: None  Estimated blood loss: <10 cc  Specimens: Right ovarian cyst wall, peritoneal lesion to pathology  Findings: On laparoscopy, there were dense omental adhesions to the midline abdominal wall below the umbilicus. These were taken down. The upper abdomen, liver surface and diaphragm surfaces were normal. Gallbladder was not visualized. The appendix had filmy adhesions to the right adnexa (postsurgical?).  We noted with 3 x 3 cm bluish discolored the cystic area near the mesentery of the descending colon, about 5 cm above the ileocecal valve. Patient will be recommended to have a CT scan for follow-up of this appearance. The uterus sounded to 9 cm. There was a stellate lesion of endometriosis to the left of anterior cul-de-sac and this was excised. There were stellate lesions on the posterior leaf of the left broad ligament lateral to the uterosacral ligament and this was vaporized. In addition there were brown lesions in the posterior cul-de-sac and lateral to the right uterosacral ligament and these were also vaporized. There were also red polypoid lesions 2 on the right uterosacral ligament.  The left tube was status post tubal-tubal anastomosis, with the site being 0.5 cm from the uterotubal junction. The fimbriated and was suspended the filmy adhesion to the reanastomosis site and this was taken down. Upon chromotubation, contrary to the preoperative HSG findings, the left tube was patent. The left ovary appeared grossly normal. The right tube was status post tubal-tubal anastomosis, with the sites  being 1 cm away from the uterotubal junction. There were filmy adhesions between the appendix and the right adnexa, as described above. The fimbriated end of the right tube was 5 out of 5 however the distal end was shorter than on the left and it was splayed over the surface of the ovary with cohesive adhesions. The right tube was also patent to chromotubation. The right ovary was enlarged with a 3.2 cm dermoid cyst at its upper pole. The cyst was removed.  Description of the procedure: The patient was placed in dorsal supine position and general endotracheal anesthesia was given. 2 g of cefazolin were given intravenously for prophylaxis. Patient was placed in lithotomy position. She was prepped and draped inside manner.  The bladder was straight catheterized . A ZUMI catheter was placed into the uterine cavity. The uterus sounded to 9 cm. The catheter was connected to a syringe containing diluted methylene blue solution. The surgeon was regloved and a surgical field was created on the abdomen.  After preemptive anesthesia of all surgical sites with 0.5% bupivacaine with 1:200,000 epinephrine, a 5 mm intraumbilical skin incision was made and a Verress needle was inserted. Its correct location was confirmed. A pneumoperitoneum was created with carbon dioxide.  5 mm laparoscope with a 30 lens was inserted and video laparoscopy was started . A left lower quadrant 5 mm and a right lower quadrant  5 mm incisions were made and ancillary trochars were placed under direct visualization. Above findings were noted.  Using harmonic Ace, we first took down the dense omental adhesions to the midline anterior abdominal wall. Next, using the needle tip electrode on a suction irrigator at this setting of 35 W cutting, 2000 and filmy adhesions between the left fimbria and the  broad ligaments and between the appendix and the right adnexa. In addition the anterior cul-de-sac lesion of endometriosis was excised using the needle  electrode and the posterior pelvic peritoneal lesions of endometriosis were ablated. Chromotubation was performed and the patency of both tubes were confirmed. We then made a 1.5 cm incision on the medial aspect of the right ovary to correspond to the ovarian cyst using the needle electrode on cutting current but could not develop a plane to perform a cystectomy. Therefore the cyst was sharply entered and its contents were aspirated. The cyst wall could now be grasped and peeled off the surrounding ovary. The tissue was placed in an universal retrieval system bag and pulled out of the abdomen and submitted to pathology. The pelvis was copiously irrigated with lactated Ringer's and aspirated, until all the keratinaceous material floating in the irrigation fluid was gone. A piece of Interceed was used to cover the incision on the right ovary which was found to be hemostatic. Hemostasis was checked. Instrument and lap pad count were correct. The gas was allowed to escape. The trochars were removed. The incisions were approximated with 4-0 Monocryl in subcuticular stitches.  The patient tolerated the procedure well and was transferred to recovery room in satisfactory condition.  Governor Specking, MD

## 2015-12-29 NOTE — Anesthesia Procedure Notes (Signed)
Procedure Name: Intubation Date/Time: 12/29/2015 1:16 PM Performed by: Mechele Claude Pre-anesthesia Checklist: Patient identified, Emergency Drugs available, Suction available and Patient being monitored Patient Re-evaluated:Patient Re-evaluated prior to inductionOxygen Delivery Method: Circle System Utilized Preoxygenation: Pre-oxygenation with 100% oxygen Intubation Type: IV induction Ventilation: Mask ventilation without difficulty Laryngoscope Size: Mac and 3 Grade View: Grade II Tube type: Oral Tube size: 7.0 mm Number of attempts: 1 Airway Equipment and Method: Stylet and LTA kit utilized Placement Confirmation: ETT inserted through vocal cords under direct vision,  positive ETCO2 and breath sounds checked- equal and bilateral Secured at: 20 cm Tube secured with: Tape Dental Injury: Teeth and Oropharynx as per pre-operative assessment

## 2015-12-29 NOTE — Discharge Instructions (Signed)

## 2015-12-29 NOTE — Anesthesia Preprocedure Evaluation (Signed)
Anesthesia Evaluation  Patient identified by MRN, date of birth, ID band Patient awake    Reviewed: Allergy & Precautions, NPO status , Patient's Chart, lab work & pertinent test results  History of Anesthesia Complications Negative for: history of anesthetic complications  Airway Mallampati: III  TM Distance: >3 FB Neck ROM: Full    Dental  (+) Teeth Intact   Pulmonary neg pulmonary ROS,    breath sounds clear to auscultation       Cardiovascular negative cardio ROS   Rhythm:Regular     Neuro/Psych negative neurological ROS  negative psych ROS   GI/Hepatic negative GI ROS, Neg liver ROS,   Endo/Other  Morbid obesity  Renal/GU negative Renal ROS     Musculoskeletal negative musculoskeletal ROS (+)   Abdominal   Peds  Hematology negative hematology ROS (+)   Anesthesia Other Findings   Reproductive/Obstetrics                             Anesthesia Physical Anesthesia Plan  ASA: II  Anesthesia Plan: General   Post-op Pain Management:    Induction: Intravenous  Airway Management Planned: Oral ETT  Additional Equipment: None  Intra-op Plan:   Post-operative Plan: Extubation in OR  Informed Consent: I have reviewed the patients History and Physical, chart, labs and discussed the procedure including the risks, benefits and alternatives for the proposed anesthesia with the patient or authorized representative who has indicated his/her understanding and acceptance.   Dental advisory given  Plan Discussed with: CRNA and Surgeon  Anesthesia Plan Comments:         Anesthesia Quick Evaluation

## 2015-12-29 NOTE — Transfer of Care (Addendum)
Immediate Anesthesia Transfer of Care Note  Patient: Amy Gross  Procedure(s) Performed: Procedure(s) (LRB): LAPAROSCOPIC RIGHT OVARIAN CYSTECTOMY; CHROMOTUBATION; LYSIS OF ADHESIONS; EXCISION AND ABLATION OF ENDOMETRIOSIS (Right)  Patient Location: PACU  Anesthesia Type: General  Level of Consciousness: drowsy  Airway & Oxygen Therapy: Patient Spontanous Breathing and Patient connected to face mask oxygen  Post-op Assessment: Report given to PACU RN and Post -op Vital signs reviewed and stable  Post vital signs: Reviewed and stable  Complications: No apparent anesthesia complications

## 2015-12-30 ENCOUNTER — Encounter (HOSPITAL_BASED_OUTPATIENT_CLINIC_OR_DEPARTMENT_OTHER): Payer: Self-pay | Admitting: Obstetrics and Gynecology

## 2016-01-01 NOTE — Anesthesia Postprocedure Evaluation (Signed)
Anesthesia Post Note  Patient: Amy Gross  Procedure(s) Performed: Procedure(s) (LRB): LAPAROSCOPIC RIGHT OVARIAN CYSTECTOMY; CHROMOTUBATION; LYSIS OF ADHESIONS; EXCISION AND ABLATION OF ENDOMETRIOSIS (Right)  Patient location during evaluation: PACU Anesthesia Type: General Level of consciousness: awake Pain management: pain level controlled Vital Signs Assessment: post-procedure vital signs reviewed and stable Respiratory status: spontaneous breathing Cardiovascular status: stable Postop Assessment: no signs of nausea or vomiting Anesthetic complications: no    Last Vitals:  Filed Vitals:   12/29/15 1600 12/29/15 1720  BP: 109/71 103/64  Pulse: 74 84  Temp:  36.9 C  Resp: 16 16    Last Pain:  Filed Vitals:   12/29/15 1729  PainSc: 2                  Nataliya Graig

## 2016-01-08 ENCOUNTER — Emergency Department (HOSPITAL_COMMUNITY)
Admission: EM | Admit: 2016-01-08 | Discharge: 2016-01-08 | Disposition: A | Discharge status: Home / Self Care | Payer: BLUE CROSS/BLUE SHIELD | Attending: Emergency Medicine | Admitting: Emergency Medicine

## 2016-01-08 ENCOUNTER — Encounter (HOSPITAL_COMMUNITY): Payer: Self-pay | Admitting: Emergency Medicine

## 2016-01-08 DIAGNOSIS — R110 Nausea: Secondary | ICD-10-CM | POA: Diagnosis not present

## 2016-01-08 DIAGNOSIS — R0602 Shortness of breath: Secondary | ICD-10-CM | POA: Diagnosis not present

## 2016-01-08 DIAGNOSIS — K219 Gastro-esophageal reflux disease without esophagitis: Secondary | ICD-10-CM | POA: Insufficient documentation

## 2016-01-08 DIAGNOSIS — R109 Unspecified abdominal pain: Secondary | ICD-10-CM | POA: Diagnosis not present

## 2016-01-08 DIAGNOSIS — R42 Dizziness and giddiness: Secondary | ICD-10-CM | POA: Diagnosis not present

## 2016-01-08 DIAGNOSIS — Z8742 Personal history of other diseases of the female genital tract: Secondary | ICD-10-CM | POA: Diagnosis not present

## 2016-01-08 DIAGNOSIS — R21 Rash and other nonspecific skin eruption: Secondary | ICD-10-CM | POA: Diagnosis not present

## 2016-01-08 DIAGNOSIS — R2233 Localized swelling, mass and lump, upper limb, bilateral: Secondary | ICD-10-CM | POA: Diagnosis not present

## 2016-01-08 DIAGNOSIS — Z79899 Other long term (current) drug therapy: Secondary | ICD-10-CM | POA: Diagnosis not present

## 2016-01-08 DIAGNOSIS — Z791 Long term (current) use of non-steroidal anti-inflammatories (NSAID): Secondary | ICD-10-CM | POA: Insufficient documentation

## 2016-01-08 DIAGNOSIS — R1310 Dysphagia, unspecified: Secondary | ICD-10-CM | POA: Diagnosis not present

## 2016-01-08 DIAGNOSIS — T7840XA Allergy, unspecified, initial encounter: Secondary | ICD-10-CM | POA: Insufficient documentation

## 2016-01-08 DIAGNOSIS — R61 Generalized hyperhidrosis: Secondary | ICD-10-CM | POA: Insufficient documentation

## 2016-01-08 DIAGNOSIS — L299 Pruritus, unspecified: Secondary | ICD-10-CM | POA: Diagnosis present

## 2016-01-08 MED ORDER — PREDNISONE 20 MG PO TABS
40.0000 mg | ORAL_TABLET | Freq: Once | ORAL | Status: AC
  Administered 2016-01-08: 40 mg via ORAL
  Filled 2016-01-08: qty 2

## 2016-01-08 MED ORDER — EPINEPHRINE 0.3 MG/0.3ML IJ SOAJ: 0.3000 mg | Freq: Once | INTRAMUSCULAR | Status: DC

## 2016-01-08 MED ORDER — DIPHENHYDRAMINE HCL 25 MG PO CAPS
25.0000 mg | ORAL_CAPSULE | Freq: Once | ORAL | Status: AC
  Administered 2016-01-08: 25 mg via ORAL
  Filled 2016-01-08: qty 1

## 2016-01-08 MED ORDER — FAMOTIDINE 20 MG PO TABS
20.0000 mg | ORAL_TABLET | Freq: Once | ORAL | Status: AC
  Administered 2016-01-08: 20 mg via ORAL
  Filled 2016-01-08: qty 1

## 2016-01-08 NOTE — ED Provider Notes (Signed)
CSN: [id number]     Arrival date & time 01/08/16  0004 History  By signing my name below, I, Helane Gunther, attest that this documentation has been prepared under the direction and in the presence of Virgel Manifold, MD. Electronically Signed: Helane Gunther, ED Scribe. 01/08/2016. 1:33 AM.    Chief Complaint  Patient presents with  . Allergic Reaction   The history is provided by the patient. No language interpreter was used.   HPI Comments: Amy Gross is a 40 y.o. female who presents to the Emergency Department complaining of an allergic reaction onset  She reports associated sharp, internal central CP (resolved), SOB (improving), diffuse red, itchy hives, nausea, diaphoresis, lightheadedness (resolved), abdominal cramping, and difficulty breathing through her nose, as well as swelling to the top of the mouth, throat, and hands. She has taken Claritin and benadryl with mild relief. She notes her last dose of benadryl was 2 hours ago. She states she had a makeover this afternoon, but had no symptoms immediately after. She also reports she has been in a car that recently had multiple different animals in it, and also ate some [place] food this evening just prior to onset. She also notes a PSHx of laparoscopic ovarian cystectomy as well as removal of scar tissue from endometriosis 10 days ago, for which she was on hydrocodone, which she finished a few days ago.   Past Medical History  Diagnosis Date  . Left ovarian cyst   . GERD (gastroesophageal reflux disease)    Past Surgical History  Procedure Laterality Date  . Tubal ligation  2009  . Microtuboplasty Bilateral March 2016  . Laparoscopic ovarian cystectomy Right 12/29/2015    Procedure: LAPAROSCOPIC RIGHT OVARIAN CYSTECTOMY; CHROMOTUBATION; LYSIS OF ADHESIONS; EXCISION AND ABLATION OF ENDOMETRIOSIS;  Surgeon: Governor Specking, MD;  Location: [facility];  Service: Gynecology;  Laterality: Right;   History reviewed. No  pertinent family history. Social History  Substance Use Topics  . Smoking status: Never Smoker   . Smokeless tobacco: Never Used  . Alcohol Use: Yes     Comment: occasional   OB History    No data available     Review of Systems  Constitutional: Positive for diaphoresis.  HENT: Positive for facial swelling and trouble swallowing.   Respiratory: Positive for shortness of breath (improving).   Cardiovascular: Positive for chest pain (resolved).  Gastrointestinal: Positive for nausea and abdominal pain.  Skin: Positive for color change and rash.  Neurological: Positive for light-headedness (resolved).  All other systems reviewed and are negative.     Allergies  Review of patient's allergies indicates no known allergies.  Home Medications   Prior to Admission medications   Medication Sig Start Date End Date Taking? Authorizing Provider  calcium carbonate (TUMS - DOSED IN MG ELEMENTAL CALCIUM) 500 MG chewable tablet Chew 1 tablet by mouth as needed for indigestion or heartburn.   Yes Historical Provider, MD  diphenhydrAMINE (BENADRYL) 25 mg capsule Take 50 mg by mouth every 6 (six) hours as needed for itching.   Yes Historical Provider, MD  loratadine (CLARITIN) 10 MG tablet Take 10 mg by mouth daily.   Yes Historical Provider, MD  naproxen sodium (ANAPROX) 220 MG tablet Take 220 mg by mouth 2 (two) times daily with a meal.   Yes Historical Provider, MD  ondansetron (ZOFRAN) 4 MG tablet Take 1 tablet (4 mg total) by mouth every 8 (eight) hours as needed for nausea or vomiting. 12/29/15  Yes Governor Specking, MD  oxyCODONE-acetaminophen (PERCOCET) 7.5-325 MG tablet Take 1 tablet by mouth every 4 (four) hours as needed. Patient not taking: Reported on 01/08/2016 12/29/15   Governor Specking, MD   BP 108/73 mmHg  Pulse 101  Temp(Src) 98.4 F (36.9 C) (Oral)  Resp 20  SpO2 96%  LMP 12/01/2015 (Exact Date) Physical Exam  Constitutional: She is oriented to person, place, and time.  She appears well-developed and well-nourished. No distress.  HENT:  Head: Normocephalic and atraumatic.  Mouth/Throat: Oropharynx is clear and moist. No oropharyngeal exudate.  Eyes: Conjunctivae and EOM are normal. Pupils are equal, round, and reactive to light. Right eye exhibits no discharge. Left eye exhibits no discharge. No scleral icterus.  Neck: Normal range of motion. Neck supple. No JVD present. No thyromegaly present.  Cardiovascular: Normal rate, regular rhythm, normal heart sounds and intact distal pulses.  Exam reveals no gallop and no friction rub.   No murmur heard. Pulmonary/Chest: Effort normal and breath sounds normal. No respiratory distress. She has no wheezes. She has no rales.  Abdominal: Soft. Bowel sounds are normal. She exhibits no distension and no mass. There is no tenderness.  Musculoskeletal: Normal range of motion. She exhibits edema. She exhibits no tenderness.  Mild swelling of bilateral hands  Lymphadenopathy:    She has no cervical adenopathy.  Neurological: She is alert and oriented to person, place, and time. Coordination normal.  Skin: Skin is warm and dry. No rash noted. She is not diaphoretic. No erythema.  Psychiatric: She has a normal mood and affect. Her behavior is normal.  Nursing note and vitals reviewed.   ED Course  Procedures  DIAGNOSTIC STUDIES: Oxygen Saturation is 96% on RA, adequate by my interpretation.    COORDINATION OF CARE: 1:27 AM - Discussed plans to observe and order medications to calm the reaction. Will give a prescription for an Epi Pen. Pt advised of plan for treatment and pt agrees.  Labs Review Labs Reviewed - No data to display  Imaging Review No results found. I have personally reviewed and evaluated these images and lab results as part of my medical decision-making.   EKG Interpretation None      MDM   Final diagnoses:  Allergic reaction, initial encounter  40 year old female with symptoms consistent  with allergic reaction. She was treated and observed with no deterioration in her symptoms. She remains hemodynamically stable. No evidence of airway compromise. At this point, I feel she is appropriate for discharge. Provided prescription for EpiPen's and reviewed indications for usage.  I personally preformed the services scribed in my presence. The recorded information has been reviewed is accurate. Virgel Manifold, MD.   Virgel Manifold, MD 01/17/16 Carollee Massed

## 2016-01-08 NOTE — ED Notes (Signed)
Pt states she is having an allergic reaction  Pt states she ate at a [employer] and after that she started having rash, chest pain, abd cramping, felt like the top of her mouth was swelling  Pt states she took a benadryl and claritin   Pt states she waited about a hour and a half then took another benadryl because she was not having any improvement  Last dose was around 11pm  Pt speech is slurred  No resp distress noted at this time

## 2016-03-09 ENCOUNTER — Ambulatory Visit (INDEPENDENT_AMBULATORY_CARE_PROVIDER_SITE_OTHER): Payer: BLUE CROSS/BLUE SHIELD | Admitting: Allergy and Immunology

## 2016-03-09 ENCOUNTER — Encounter: Payer: Self-pay | Admitting: Allergy and Immunology

## 2016-03-09 VITALS — BP 110/75 | HR 80 | Temp 97.7°F | Resp 16 | Ht 63.39 in | Wt 230.2 lb

## 2016-03-09 DIAGNOSIS — T7840XA Allergy, unspecified, initial encounter: Secondary | ICD-10-CM | POA: Diagnosis not present

## 2016-03-09 DIAGNOSIS — H1045 Other chronic allergic conjunctivitis: Secondary | ICD-10-CM | POA: Diagnosis not present

## 2016-03-09 DIAGNOSIS — H1010 Acute atopic conjunctivitis, unspecified eye: Secondary | ICD-10-CM | POA: Insufficient documentation

## 2016-03-09 DIAGNOSIS — J3089 Other allergic rhinitis: Secondary | ICD-10-CM | POA: Insufficient documentation

## 2016-03-09 MED ORDER — OLOPATADINE HCL 0.1 % OP SOLN: 1.0000 [drp] | Freq: Two times a day (BID) | OPHTHALMIC | Status: DC

## 2016-03-09 MED ORDER — FLUTICASONE PROPIONATE 50 MCG/ACT NA SUSP: 2.0000 | Freq: Every day | NASAL | Status: DC

## 2016-03-09 MED ORDER — EPINEPHRINE 0.3 MG/0.3ML IJ SOAJ: 0.3000 mg | Freq: Once | INTRAMUSCULAR | Status: AC

## 2016-03-09 MED ORDER — LEVOCETIRIZINE DIHYDROCHLORIDE 5 MG PO TABS: 5.0000 mg | ORAL_TABLET | Freq: Every evening | ORAL | Status: DC

## 2016-03-09 NOTE — Patient Instructions (Addendum)
Allergic reaction  The following labs have been ordered: Baseline serum tryptase, C4 level, and galactose-alpha-1,3-galactose IgE level. An additional lab order for serum tryptase has been provided which is to be kept by the patient to be drawn in the emergency department within 4 hours of symptom onset should symptoms recur.  Should symptoms recur the patient has been asked to keep a journal to record any foods eaten, beverages consumed, medications taken within a 6 hour period prior to the onset of symptoms, as well as record activities being performed, and environmental conditions. For any symptoms concerning for anaphylaxis, epinephrine is to be administered and 911 is to be called immediately.  A prescription has been provided for EpiPen 0.3 mg 2 pack along with instructions for its proper administration.  An allergy action plan has been discussed and provided in written form.  Perennial and seasonal allergic rhinitis  Aeroallergen avoidance measures have been discussed and provided in written form.  A prescription has been provided for levocetirizine, 5 mg daily as needed.  A prescription has been provided for fluticasone nasal spray, 2 sprays per nostril daily as needed. Proper nasal spray technique has been discussed and demonstrated.  The risks and benefits of aeroallergen immunotherapy have been discussed. The patient is motivated to initiate immunotherapy if insurance coverage is favorable. She will let us know how she would like to proceed.  Seasonal allergic conjunctivitis  Treatment plan as outlined above for allergic rhinitis.  A prescription has been provided for Pazeo, one drop per eye daily as needed.    Return in about 4 months (around 07/09/2016), or if symptoms worsen or fail to improve.  Reducing Pollen Exposure  The American Academy of Allergy, Asthma and Immunology suggests the following steps to reduce your exposure to pollen during allergy seasons.    1. Do  not hang sheets or clothing out to dry; pollen may collect on these items. 2. Do not mow lawns or spend time around freshly cut grass; mowing stirs up pollen. 3. Keep windows closed at night.  Keep car windows closed while driving. 4. Minimize morning activities outdoors, a time when pollen counts are usually at their highest. 5. Stay indoors as much as possible when pollen counts or humidity is high and on windy days when pollen tends to remain in the air longer. 6. Use air conditioning when possible.  Many air conditioners have filters that trap the pollen spores. 7. Use a HEPA room air filter to remove pollen form the indoor air you breathe.   Control of House Dust Mite Allergen  House dust mites play a major role in allergic asthma and rhinitis.  They occur in environments with high humidity wherever human skin, the food for dust mites is found. High levels have been detected in dust obtained from mattresses, pillows, carpets, upholstered furniture, bed covers, clothes and soft toys.  The principal allergen of the house dust mite is found in its feces.  A gram of dust may contain 1,000 mites and 250,000 fecal particles.  Mite antigen is easily measured in the air during house cleaning activities.    1. Encase mattresses, including the box spring, and pillow, in an air tight cover.  Seal the zipper end of the encased mattresses with wide adhesive tape. 2. Wash the bedding in water of 130 degrees Farenheit weekly.  Avoid cotton comforters/quilts and flannel bedding: the most ideal bed covering is the dacron comforter. 3. Remove all upholstered furniture from the bedroom. 4. Remove carpets, carpet  padding, rugs, and non-washable window drapes from the bedroom.  Wash drapes weekly or use plastic window coverings. 5. Remove all non-washable stuffed toys from the bedroom.  Wash stuffed toys weekly. 6. Have the room cleaned frequently with a vacuum cleaner and a damp dust-mop.  The patient should not  be in a room which is being cleaned and should wait 1 hour after cleaning before going into the room. 7. Close and seal all heating outlets in the bedroom.  Otherwise, the room will become filled with dust-laden air.  An electric heater can be used to heat the room. Reduce indoor humidity to less than 50%.  Do not use a humidifier.  Control of Dog or Cat Allergen  Avoidance is the best way to manage a dog or cat allergy. If you have a dog or cat and are allergic to dog or cats, consider removing the dog or cat from the home. If you have a dog or cat but don't want to find it a new home, or if your family wants a pet even though someone in the household is allergic, here are some strategies that may help keep symptoms at bay:  1. Keep the pet out of your bedroom and restrict it to only a few rooms. Be advised that keeping the dog or cat in only one room will not limit the allergens to that room. 2. Don't pet, hug or kiss the dog or cat; if you do, wash your hands with soap and water. 3. High-efficiency particulate air (HEPA) cleaners run continuously in a bedroom or living room can reduce allergen levels over time. 4. Regular use of a high-efficiency vacuum cleaner or a central vacuum can reduce allergen levels. 5. Giving your dog or cat a bath at least once a week can reduce airborne allergen.  Control of Mold Allergen  Mold and fungi can grow on a variety of surfaces provided certain temperature and moisture conditions exist.  Outdoor molds grow on plants, decaying vegetation and soil.  The major outdoor mold, Alternaria and Cladosporium, are found in very high numbers during hot and dry conditions.  Generally, a late Summer - Fall peak is seen for common outdoor fungal spores.  Rain will temporarily lower outdoor mold spore count, but counts rise rapidly when the rainy period ends.  The most important indoor molds are Aspergillus and Penicillium.  Dark, humid and poorly ventilated basements are  ideal sites for mold growth.  The next most common sites of mold growth are the bathroom and the kitchen.  Outdoor [employer] 1. Use air conditioning and keep windows closed 2. Avoid exposure to decaying vegetation. 3. Avoid leaf raking. 4. Avoid grain handling. 5. Consider wearing a face mask if working in moldy areas.  Indoor Mold Control 1. Maintain humidity below 50%. 2. Clean washable surfaces with 5% bleach solution. 3. Remove sources e.g. Contaminated carpets.

## 2016-03-09 NOTE — Progress Notes (Addendum)
New Patient Note  RE: Amy Gross MRN: [id number] DOB: 06-08-1976 Date of Office Visit: 03/09/2016  Referring provider: No ref. provider found Primary care provider: No PCP Per Patient  Chief Complaint: Allergy Testing   History of present illness: HPI Comments: Amy Gross is a 40 y.o. female presenting today for evaluation of allergic reactions and rhinoconjunctivitis.  She reports that approximately 5 years ago she had an allergic reaction consisting of hives "all over", the sensation of throat tightness, chest tightness, lightheadedness, and nausea.  She took diphenhydramine and the symptoms gradually resolved over the next few hours.  She did not seek medical attention.  In early 2017, she had an allergic reaction with similar characteristics.  On this occasion she took Benadryl and proceeded to the emergency department for evaluation and treatment.  She was treated with prednisone, diphenhydramine, and famotidine and discharged with a prescription for an epinephrine autoinjector.  She did not have the EpiPen prescription filled.  Nneoma complains of frequent and severe nasal congestion, rhinorrhea, postnasal drainage, sore throat, itchy throat, itchy years, and ocular pruritus.  The symptoms occur year around but are more frequent and severe in the springtime and fall.   Assessment and plan: Allergic reaction  The following labs have been ordered: Baseline serum tryptase, C4 level, and galactose-alpha-1,3-galactose IgE level. An additional lab order for serum tryptase has been provided which is to be kept by the patient to be drawn in the emergency department within 4 hours of symptom onset should symptoms recur.  Should symptoms recur the patient has been asked to keep a journal to record any foods eaten, beverages consumed, medications taken within a 6 hour period prior to the onset of symptoms, as well as record activities being performed, and environmental conditions. For any  symptoms concerning for anaphylaxis, epinephrine is to be administered and 911 is to be called immediately.  A prescription has been provided for EpiPen 0.3 mg 2 pack along with instructions for its proper administration.  An allergy action plan has been discussed and provided in written form.  Perennial and seasonal allergic rhinitis  Aeroallergen avoidance measures have been discussed and provided in written form.  A prescription has been provided for levocetirizine, 5 mg daily as needed.  A prescription has been provided for fluticasone nasal spray, 2 sprays per nostril daily as needed. Proper nasal spray technique has been discussed and demonstrated.  The risks and benefits of aeroallergen immunotherapy have been discussed. The patient is motivated to initiate immunotherapy if insurance coverage is favorable. She will let us know how she would like to proceed.  Seasonal allergic conjunctivitis  Treatment plan as outlined above for allergic rhinitis.  A prescription has been provided for Pazeo, one drop per eye daily as needed.    Meds ordered this encounter  Medications  . levocetirizine (XYZAL) 5 MG tablet    Sig: Take 1 tablet (5 mg total) by mouth every evening.    Dispense:  30 tablet    Refill:  5  . fluticasone (FLONASE) 50 MCG/ACT nasal spray    Sig: Place 2 sprays into both nostrils daily.    Dispense:  16 g    Refill:  5  . olopatadine (PATANOL) 0.1 % ophthalmic solution    Sig: Place 1 drop into both eyes 2 (two) times daily.    Dispense:  5 mL    Refill:  5  . EPINEPHrine 0.3 mg/0.3 mL IJ SOAJ injection    Sig: Inject 0.3 mLs (  0.3 mg total) into the muscle once.    Dispense:  2 Device    Refill:  1    Diagnositics: Epicutaneous testing: Positive to grass pollens, tree pollens, molds, cat hair, and dust mite antigen. Intradermal testing: Positive to ragweed pollen, weed pollens, and dog epithelia. Food allergen skin testing: Negative despite a positive  histamine control.    Physical examination: Blood pressure 110/75, pulse 80, temperature 97.7 F (36.5 C), temperature source Oral, resp. rate 16, height 5' 3.39" (1.61 m), weight 230 lb 2.6 oz (104.4 kg).  General: Alert, interactive, in no acute distress. HEENT: TMs pearly gray, turbinates edematous and pale with clear discharge, post-pharynx erythematous. Neck: Supple without lymphadenopathy. Lungs: Clear to auscultation without wheezing, rhonchi or rales. CV: Normal S1, S2 without murmurs. Abdomen: Nondistended, nontender. Skin: Warm and dry, without lesions or rashes. Extremities:  No clubbing, cyanosis or edema. Neuro:   Grossly intact.  Review of systems:  Review of Systems  Constitutional: Negative for fever, chills and weight loss.  HENT: Positive for congestion. Negative for nosebleeds.   Eyes: Negative for blurred vision.  Respiratory: Negative for cough, hemoptysis and wheezing.   Cardiovascular: Negative for chest pain.  Gastrointestinal: Negative for diarrhea and constipation.  Genitourinary: Negative for dysuria.  Musculoskeletal: Negative for myalgias and joint pain.  Neurological: Negative for dizziness.  Endo/Heme/Allergies: Positive for environmental allergies. Does not bruise/bleed easily.    Past medical history:  Past Medical History  Diagnosis Date  . Left ovarian cyst   . GERD (gastroesophageal reflux disease)     Past surgical history:  Past Surgical History  Procedure Laterality Date  . Tubal ligation  2009  . Microtuboplasty Bilateral March 2016  . Laparoscopic ovarian cystectomy Right 12/29/2015    Procedure: LAPAROSCOPIC RIGHT OVARIAN CYSTECTOMY; CHROMOTUBATION; LYSIS OF ADHESIONS; EXCISION AND ABLATION OF ENDOMETRIOSIS;  Surgeon: Governor Specking, MD;  Location: [facility];  Service: Gynecology;  Laterality: Right;    Family history: Family History  Problem Relation Age of Onset  . Allergic rhinitis Mother   . Urticaria  Mother   . Angioedema Neg Hx   . Asthma Neg Hx   . Atopy Neg Hx   . Eczema Neg Hx   . Immunodeficiency Neg Hx     Social history: Social History   Social History  . Marital Status: Married    Spouse Name: N/A  . Number of Children: N/A  . Years of Education: N/A   Occupational History  . Not on file.   Social History Main Topics  . Smoking status: Never Smoker   . Smokeless tobacco: Never Used  . Alcohol Use: 0.0 oz/week    0 Standard drinks or equivalent per week     Comment: occasional  . Drug Use: No  . Sexual Activity: Not on file   Other Topics Concern  . Not on file   Social History Narrative   Environmental History: The patient lives in a 39 year old house with carpeting throughout and central air/heat.  She is a nonsmoker without pets inside the home.  There is a dog which resides outside the home.    Medication List       This list is accurate as of: 03/09/16  6:19 PM.  Always use your most recent med list.               calcium carbonate 500 MG chewable tablet  Commonly known as:  TUMS - dosed in mg elemental calcium  Chew 1 tablet  by mouth as needed for indigestion or heartburn.     diphenhydrAMINE 25 mg capsule  Commonly known as:  BENADRYL  Take 50 mg by mouth every 6 (six) hours as needed for itching.     EPINEPHrine 0.3 mg/0.3 mL Soaj injection  Commonly known as:  EPI-PEN  Inject 0.3 mLs (0.3 mg total) into the muscle once.     EPINEPHrine 0.3 mg/0.3 mL Soaj injection  Commonly known as:  EPI-PEN  Inject 0.3 mLs (0.3 mg total) into the muscle once.     fluticasone 50 MCG/ACT nasal spray  Commonly known as:  FLONASE  Place 2 sprays into both nostrils daily.     levocetirizine 5 MG tablet  Commonly known as:  XYZAL  Take 1 tablet (5 mg total) by mouth every evening.     loratadine 10 MG tablet  Commonly known as:  CLARITIN  Take 10 mg by mouth daily.     naproxen sodium 220 MG tablet  Commonly known as:  ANAPROX  Take 220 mg by  mouth 2 (two) times daily with a meal.     olopatadine 0.1 % ophthalmic solution  Commonly known as:  PATANOL  Place 1 drop into both eyes 2 (two) times daily.     ondansetron 4 MG tablet  Commonly known as:  ZOFRAN  Take 1 tablet (4 mg total) by mouth every 8 (eight) hours as needed for nausea or vomiting.     oxyCODONE-acetaminophen 7.5-325 MG tablet  Commonly known as:  PERCOCET  Take 1 tablet by mouth every 4 (four) hours as needed.        Known medication allergies: No Known Allergies  I appreciate the opportunity to take part in this Nishi's care. Please do not hesitate to contact me with questions.  Sincerely,   R. Edgar Frisk, MD

## 2016-03-09 NOTE — Assessment & Plan Note (Signed)
   Aeroallergen avoidance measures have been discussed and provided in written form.  A prescription has been provided for levocetirizine, 5 mg daily as needed.  A prescription has been provided for fluticasone nasal spray, 2 sprays per nostril daily as needed. Proper nasal spray technique has been discussed and demonstrated.  The risks and benefits of aeroallergen immunotherapy have been discussed. The patient is motivated to initiate immunotherapy if insurance coverage is favorable. She will let us know how she would like to proceed.

## 2016-03-09 NOTE — Assessment & Plan Note (Signed)
   The following labs have been ordered: Baseline serum tryptase, C4 level, and galactose-alpha-1,3-galactose IgE level. An additional lab order for serum tryptase has been provided which is to be kept by the patient to be drawn in the emergency department within 4 hours of symptom onset should symptoms recur.  Should symptoms recur the patient has been asked to keep a journal to record any foods eaten, beverages consumed, medications taken within a 6 hour period prior to the onset of symptoms, as well as record activities being performed, and environmental conditions. For any symptoms concerning for anaphylaxis, epinephrine is to be administered and 911 is to be called immediately.  A prescription has been provided for EpiPen 0.3 mg 2 pack along with instructions for its proper administration.  An allergy action plan has been discussed and provided in written form.

## 2016-03-09 NOTE — Assessment & Plan Note (Signed)
   Treatment plan as outlined above for allergic rhinitis.  A prescription has been provided for Pazeo, one drop per eye daily as needed. 

## 2016-03-12 LAB — C4 COMPLEMENT: C4 Complement: 37 mg/dL (ref 10–40)

## 2016-03-14 LAB — GALACTOSE-ALPHA-1,3-GALACTOSE IGE: Galactose-alpha-1,3-galactose IgE: <0.1 kU/L (ref <0.35)

## 2016-03-16 ENCOUNTER — Telehealth: Payer: Self-pay

## 2016-03-16 NOTE — Telephone Encounter (Signed)
Spoke with [employer]. They state that Tryptase was not ordered. I also do not see the order in Epic. Lab specimen has been discarded. Patient would need to be re-drawn.

## 2016-03-16 NOTE — Telephone Encounter (Signed)
-----   Message from Adelina Mings, MD sent at 03/16/2016  8:37 AM EDT ----- A tryptase level was ordered but I do not see it in the results.  Please call the lab to inquire about that result.  Thanks.

## 2016-03-16 NOTE — Telephone Encounter (Signed)
Noted  

## 2016-03-25 NOTE — Addendum Note (Signed)
Addended by: Golda Acre C on: 03/25/2016 11:45 AM   Modules accepted: Orders

## 2016-03-30 DIAGNOSIS — J301 Allergic rhinitis due to pollen: Secondary | ICD-10-CM | POA: Diagnosis not present

## 2016-03-31 DIAGNOSIS — J3089 Other allergic rhinitis: Secondary | ICD-10-CM | POA: Diagnosis not present

## 2016-04-12 ENCOUNTER — Ambulatory Visit (INDEPENDENT_AMBULATORY_CARE_PROVIDER_SITE_OTHER): Payer: BLUE CROSS/BLUE SHIELD | Admitting: *Deleted

## 2016-04-12 DIAGNOSIS — J309 Allergic rhinitis, unspecified: Secondary | ICD-10-CM

## 2016-04-12 NOTE — Progress Notes (Signed)
Immunotherapy   Patient Details  Name: Amy Gross MRN: [id number] Date of Birth: [birth year]  04/12/2016  [name] started injections for  <MOLD-DMITE-CAT-DOG/GRASS-WEEDS-TREE> Following schedule: A  Frequency:2 times per week Epi-Pen:Epi-Pen Available  Consent signed and patient instructions given.   Orlene Erm 04/12/2016, 3:58 PM

## 2016-06-14 ENCOUNTER — Ambulatory Visit (INDEPENDENT_AMBULATORY_CARE_PROVIDER_SITE_OTHER): Payer: BLUE CROSS/BLUE SHIELD

## 2016-06-14 DIAGNOSIS — J309 Allergic rhinitis, unspecified: Secondary | ICD-10-CM

## 2017-01-19 ENCOUNTER — Other Ambulatory Visit: Payer: Self-pay | Admitting: Obstetrics & Gynecology

## 2017-01-19 DIAGNOSIS — N6452 Nipple discharge: Secondary | ICD-10-CM

## 2017-01-24 ENCOUNTER — Other Ambulatory Visit: Payer: Self-pay | Admitting: Obstetrics & Gynecology

## 2017-01-24 DIAGNOSIS — N6452 Nipple discharge: Secondary | ICD-10-CM

## 2017-01-25 ENCOUNTER — Ambulatory Visit
Admission: RE | Admit: 2017-01-25 | Discharge: 2017-01-25 | Disposition: A | Discharge status: Home / Self Care | Payer: BLUE CROSS/BLUE SHIELD | Source: Ambulatory Visit | Attending: Obstetrics & Gynecology | Admitting: Obstetrics & Gynecology

## 2017-01-25 DIAGNOSIS — N6452 Nipple discharge: Secondary | ICD-10-CM

## 2017-02-20 ENCOUNTER — Ambulatory Visit (INDEPENDENT_AMBULATORY_CARE_PROVIDER_SITE_OTHER): Payer: BLUE CROSS/BLUE SHIELD | Admitting: Allergy and Immunology

## 2017-02-20 ENCOUNTER — Encounter: Payer: Self-pay | Admitting: Allergy and Immunology

## 2017-02-20 VITALS — BP 112/68 | HR 70 | Resp 16 | Ht 63.0 in | Wt 236.0 lb

## 2017-02-20 DIAGNOSIS — J3089 Other allergic rhinitis: Secondary | ICD-10-CM

## 2017-02-20 DIAGNOSIS — B9789 Other viral agents as the cause of diseases classified elsewhere: Secondary | ICD-10-CM | POA: Diagnosis not present

## 2017-02-20 DIAGNOSIS — J069 Acute upper respiratory infection, unspecified: Secondary | ICD-10-CM

## 2017-02-20 DIAGNOSIS — L500 Allergic urticaria: Secondary | ICD-10-CM | POA: Diagnosis not present

## 2017-02-20 DIAGNOSIS — T7840XD Allergy, unspecified, subsequent encounter: Secondary | ICD-10-CM

## 2017-02-20 NOTE — Progress Notes (Signed)
Follow-up Note  RE: Katara Griner MRN: [id number] DOB: 04-07-1976 Date of Office Visit: 02/20/2017  Primary care provider: No PCP Per Patient Referring provider: No ref. provider found  History of present illness: Kayla Roman is a 41 y.o. female with allergic rhinoconjunctivitis and history of allergic reactions presenting today for sick visit.  She is previously seen in this clinic for her initial evaluation in April 2017. She had started immunotherapy injections but discontinued in August 2017 because of her schedule though she would to restart. She has been experiencing ear canal pruritus and post nasal drainage.  Over last 24 hours she has experienced increased thick postnasal drainage as well as irritated/sore throat, and low-grade fever.  She is currently taking levocetirizine as needed but is not taking medicated nasal spray because of occasional epistaxis. She developed a few hives on her forearms one or 2 weeks ago and noted that it occurred on a day with high pollen counts.  She did not experience concomitant cardiopulmonary or GI symptoms and has not had recurrence of the allergic reactions she had experienced over a year ago.   Assessment and plan: Perennial and seasonal allergic rhinitis  Restart aeroallergen immunotherapy.  Continue appropriate allergen avoidance measures and levocetirizine 5 mg daily as needed.  Nasal saline lavage (NeilMed) has been recommended as needed along with instructions for proper administration.  For thick post nasal drainage, add guaifenesin [phone] mg (Mucinex)  twice daily as needed with adequate hydration as discussed.  Viral URI  Supportive care directed at symptom relief.  Nasal saline lavage and guaifenesin (as above).  Allergic urticaria The history suggests allergic urticaria secondary to pollen exposure.  Allergen avoidance measures and levocetirizine daily as needed.  If symptoms persist or progress and do not seem to correlate  with pollen exposure, we will evaluate further.   Physical examination: Blood pressure 112/68, pulse 70, resp. rate 16, height 5\' 3"  (1.6 m), weight 236 lb (107 kg), SpO2 97 %.  General: Alert, interactive, in no acute distress. HEENT: TMs pearly gray, turbinates moderately edematous without discharge, post-pharynx moderately erythematous. Neck: Supple without lymphadenopathy. Lungs: Clear to auscultation without wheezing, rhonchi or rales. CV: Normal S1, S2 without murmurs. Skin: Warm and dry, without lesions or rashes.  The following portions of the patient's history were reviewed and updated as appropriate: allergies, current medications, past family history, past medical history, past social history, past surgical history and problem list.  Allergies as of 02/20/2017   No Known Allergies     Medication List       Accurate as of 02/20/17 12:29 PM. Always use your most recent med list.          diphenhydrAMINE 25 mg capsule Commonly known as:  BENADRYL Take 50 mg by mouth every 6 (six) hours as needed for itching.   EPINEPHrine 0.3 mg/0.3 mL Soaj injection Commonly known as:  EPI-PEN Inject 0.3 mLs (0.3 mg total) into the muscle once.   fluticasone 50 MCG/ACT nasal spray Commonly known as:  FLONASE Place 2 sprays into both nostrils daily.   levocetirizine 5 MG tablet Commonly known as:  XYZAL Take 1 tablet (5 mg total) by mouth every evening.   loratadine 10 MG tablet Commonly known as:  CLARITIN Take 10 mg by mouth daily.   naproxen sodium 220 MG tablet Commonly known as:  ANAPROX Take 220 mg by mouth 2 (two) times daily with a meal.   olopatadine 0.1 % ophthalmic solution Commonly known as:  PATANOL  Place 1 drop into both eyes 2 (two) times daily.   ondansetron 4 MG tablet Commonly known as:  ZOFRAN Take 1 tablet (4 mg total) by mouth every 8 (eight) hours as needed for nausea or vomiting.       No Known Allergies  I appreciate the opportunity to take  part in Natha's care. Please do not hesitate to contact me with questions.  Sincerely,   R. Edgar Frisk, MD

## 2017-02-20 NOTE — Patient Instructions (Signed)
Perennial and seasonal allergic rhinitis  Restart aeroallergen immunotherapy.  Continue appropriate allergen avoidance measures and levocetirizine 5 mg daily as needed.  Nasal saline lavage (NeilMed) has been recommended as needed along with instructions for proper administration.  For thick post nasal drainage, add guaifenesin [phone] mg (Mucinex)  twice daily as needed with adequate hydration as discussed.  Viral URI  Supportive care directed at symptom relief.  Nasal saline lavage and guaifenesin (as above).  Allergic urticaria The history suggests allergic urticaria secondary to pollen exposure.  Allergen avoidance measures and levocetirizine daily as needed.  If symptoms persist or progress and do not seem to correlate with pollen exposure, we will evaluate further.   Return in about 6 months (around 08/22/2017), or if symptoms worsen or fail to improve.

## 2017-02-20 NOTE — Assessment & Plan Note (Signed)
The history suggests allergic urticaria secondary to pollen exposure.  Allergen avoidance measures and levocetirizine daily as needed.  If symptoms persist or progress and do not seem to correlate with pollen exposure, we will evaluate further.

## 2017-02-20 NOTE — Assessment & Plan Note (Signed)
   Restart aeroallergen immunotherapy.  Continue appropriate allergen avoidance measures and levocetirizine 5 mg daily as needed.  Nasal saline lavage (NeilMed) has been recommended as needed along with instructions for proper administration.  For thick post nasal drainage, add guaifenesin [phone] mg (Mucinex)  twice daily as needed with adequate hydration as discussed.

## 2017-02-20 NOTE — Assessment & Plan Note (Signed)
   Supportive care directed at symptom relief.  Nasal saline lavage and guaifenesin (as above).

## 2017-03-13 ENCOUNTER — Ambulatory Visit (INDEPENDENT_AMBULATORY_CARE_PROVIDER_SITE_OTHER): Payer: BLUE CROSS/BLUE SHIELD | Admitting: *Deleted

## 2017-03-13 DIAGNOSIS — J309 Allergic rhinitis, unspecified: Secondary | ICD-10-CM | POA: Diagnosis not present

## 2017-03-15 DIAGNOSIS — J301 Allergic rhinitis due to pollen: Secondary | ICD-10-CM | POA: Diagnosis not present

## 2017-03-15 NOTE — Progress Notes (Signed)
Immunotherapy   Patient Details  Name: Amy Gross MRN: [id number] Date of Birth: 18-Jan-1976  03/15/2017  [name] started injections for  Mold-Mite-Cat-Dog & Grass-Weed-Tree. Following schedule: A  Frequency:1 time per week Epi-Pen:Epi-Pen Available  Consent signed and patient instructions given. No problems after 30 minutes in office.   Constance Holster 03/15/2017, 4:58 PM

## 2017-03-16 DIAGNOSIS — J3089 Other allergic rhinitis: Secondary | ICD-10-CM | POA: Diagnosis not present

## 2017-03-16 NOTE — Progress Notes (Signed)
Vials made 03-16-17  jm

## 2017-03-29 ENCOUNTER — Ambulatory Visit (INDEPENDENT_AMBULATORY_CARE_PROVIDER_SITE_OTHER): Payer: BLUE CROSS/BLUE SHIELD

## 2017-03-29 DIAGNOSIS — J309 Allergic rhinitis, unspecified: Secondary | ICD-10-CM | POA: Diagnosis not present

## 2017-06-13 ENCOUNTER — Emergency Department (HOSPITAL_COMMUNITY): Admit: 2017-06-13 | Payer: No Typology Code available for payment source | Admitting: Cardiology

## 2017-06-13 ENCOUNTER — Encounter (HOSPITAL_COMMUNITY): Payer: Self-pay

## 2017-06-13 SURGERY — LEFT HEART CATH AND CORONARY ANGIOGRAPHY
Anesthesia: LOCAL

## 2018-03-20 ENCOUNTER — Ambulatory Visit: Payer: BLUE CROSS/BLUE SHIELD | Admitting: Allergy and Immunology

## 2018-03-20 ENCOUNTER — Encounter: Payer: Self-pay | Admitting: Allergy and Immunology

## 2018-03-20 DIAGNOSIS — R040 Epistaxis: Secondary | ICD-10-CM | POA: Diagnosis not present

## 2018-03-20 DIAGNOSIS — J3089 Other allergic rhinitis: Secondary | ICD-10-CM

## 2018-03-20 DIAGNOSIS — H1010 Acute atopic conjunctivitis, unspecified eye: Secondary | ICD-10-CM

## 2018-03-20 DIAGNOSIS — L237 Allergic contact dermatitis due to plants, except food: Secondary | ICD-10-CM | POA: Diagnosis not present

## 2018-03-20 MED ORDER — OLOPATADINE HCL 0.7 % OP SOLN: 1.0000 [drp] | OPHTHALMIC | 5 refills | Status: DC

## 2018-03-20 MED ORDER — LEVOCETIRIZINE DIHYDROCHLORIDE 5 MG PO TABS: 5.0000 mg | ORAL_TABLET | Freq: Every evening | ORAL | 5 refills | Status: AC

## 2018-03-20 MED ORDER — EPINEPHRINE 0.3 MG/0.3ML IJ SOAJ: 0.3000 mg | Freq: Once | INTRAMUSCULAR | 2 refills | Status: AC

## 2018-03-20 NOTE — Patient Instructions (Addendum)
Epistaxis  Proper technique for stanching epistaxis has been discussed and demonstrated.  Nasal saline spray and/or nasal saline gel is recommended to moisturize nasal mucosa.  The use of a cool-mist humidifier during the night is recommended.  During epistaxis, if needed, oxymetazoline (Afrin) nasal sparay may be applied to a cotton ball to help stanch the blood flow.  If this problem persists or progresses, otolaryngology evaluation may be warranted.   Perennial and seasonal allergic rhinitis  Continue appropriate allergen avoidance measures.  A refill prescription has been provided for levocetirizine 5 mg daily as needed.  Nasal saline spray (i.e., Simply Saline) or nasal saline lavage (i.e., NeilMed) is recommended as needed.  Consider restarting aeroallergen immunotherapy.  Seasonal allergic conjunctivitis  Treatment plan as outlined above for allergic rhinitis.  A prescription has been provided for Pazeo, one drop per eye daily as needed.  I have also recommended eye lubricant drops (i.e., Natural Tears) as needed.  Allergic contact dermatitis due to plants The patient's history suggests allergic contact dermatitis, most likely due to contact with poison ivy, poison oak, or poison sumac. Resolved.   Return in about 1 year (around 03/21/2019), or if symptoms worsen or fail to improve.

## 2018-03-20 NOTE — Assessment & Plan Note (Addendum)
The patient's history suggests allergic contact dermatitis, most likely due to contact with poison ivy, poison oak, or poison sumac. Resolved.

## 2018-03-20 NOTE — Assessment & Plan Note (Signed)
   Treatment plan as outlined above for allergic rhinitis.  A prescription has been provided for Pazeo, one drop per eye daily as needed.  I have also recommended eye lubricant drops (i.e., Natural Tears) as needed. 

## 2018-03-20 NOTE — Progress Notes (Signed)
Follow-up Note  RE: Amy Gross MRN: [id number] DOB: 05/16/1976 Date of Office Visit: 03/20/2018  Primary care provider: Patient, No Pcp Per Referring provider: No ref. provider found  History of present illness: Amy Gross is a 42 y.o. female with allergic rhinoconjunctivitis and history of allergic reactions presenting today for follow-up.  She was last seen in this clinic in April 2018.  She reports that she has been experiencing episodes of epistaxis.  She typically has these episodes 1-2 times per month though at times they can be as frequent as 1 or 2 times per week.  It typically takes 5 to 10 minutes to stanch the blood flow.  The blood flows exclusively from the right nostril.  She had to discontinue allergy injections last summer because her schedule did not permit it.  She is interested in restarting the injections but believes that she may need FMLA paperwork filled out to do so.  She has been experiencing nasal pruritus, ocular pruritus, and ear canal pruritus.  These symptoms are most frequent and severe with pollen exposure.  She reports that last July she developed small, clustered vesicles on her lower extremities.  She is uncertain what triggered this rash.  She did not experience concomitant angioedema, cardiopulmonary symptoms, or GI symptoms.  Assessment and plan: Epistaxis  Proper technique for stanching epistaxis has been discussed and demonstrated.  Nasal saline spray and/or nasal saline gel is recommended to moisturize nasal mucosa.  The use of a cool-mist humidifier during the night is recommended.  During epistaxis, if needed, oxymetazoline (Afrin) nasal sparay may be applied to a cotton ball to help stanch the blood flow.  If this problem persists or progresses, otolaryngology evaluation may be warranted.   Perennial and seasonal allergic rhinitis  Continue appropriate allergen avoidance measures.  A refill prescription has been provided for levocetirizine  5 mg daily as needed.  Nasal saline spray (i.e., Simply Saline) or nasal saline lavage (i.e., NeilMed) is recommended as needed.  Consider restarting aeroallergen immunotherapy.  Seasonal allergic conjunctivitis  Treatment plan as outlined above for allergic rhinitis.  A prescription has been provided for Pazeo, one drop per eye daily as needed.  I have also recommended eye lubricant drops (i.e., Natural Tears) as needed.  Allergic contact dermatitis due to plants The patient's history suggests allergic contact dermatitis, most likely due to contact with poison ivy, poison oak, or poison sumac. Resolved.   Meds ordered this encounter  Medications  . levocetirizine (XYZAL) 5 MG tablet    Sig: Take 1 tablet (5 mg total) by mouth every evening.    Dispense:  30 tablet    Refill:  5  . Olopatadine HCl (PAZEO) 0.7 % SOLN    Sig: Place 1 drop into both eyes 1 day or 1 dose.    Dispense:  1 Bottle    Refill:  5  . EPINEPHrine (EPIPEN 2-PAK) 0.3 mg/0.3 mL IJ SOAJ injection    Sig: Inject 0.3 mLs (0.3 mg total) into the muscle once for 1 dose.    Dispense:  2 Device    Refill:  2    Physical examination: Blood pressure 104/60, pulse 76, temperature 98.2 F (36.8 C), temperature source Oral, resp. rate 16, height 5\' 2"  (1.575 m), weight 221 lb (100.2 kg), last menstrual period 03/19/2018, SpO2 98 %.  General: Alert, interactive, in no acute distress. HEENT: TMs pearly gray, turbinates edematous with clear discharge, post-pharynx mildly erythematous. Neck: Supple without lymphadenopathy. Lungs: Clear to auscultation  without wheezing, rhonchi or rales. CV: Normal S1, S2 without murmurs. Skin: Warm and dry, without lesions or rashes.  The following portions of the patient's history were reviewed and updated as appropriate: allergies, current medications, past family history, past medical history, past social history, past surgical history and problem list.  Allergies as of 03/20/2018    No Known Allergies     Medication List        Accurate as of 03/20/18  4:36 PM. Always use your most recent med list.          diphenhydrAMINE 25 mg capsule Commonly known as:  BENADRYL Take 50 mg by mouth every 6 (six) hours as needed for itching.   EPINEPHrine 0.3 mg/0.3 mL Soaj injection Commonly known as:  EPI-PEN Inject 0.3 mLs (0.3 mg total) into the muscle once.   EPINEPHrine 0.3 mg/0.3 mL Soaj injection Commonly known as:  EPIPEN 2-PAK Inject 0.3 mLs (0.3 mg total) into the muscle once for 1 dose.   fluticasone 50 MCG/ACT nasal spray Commonly known as:  FLONASE Place 2 sprays into both nostrils daily.   levocetirizine 5 MG tablet Commonly known as:  XYZAL Take 1 tablet (5 mg total) by mouth every evening.   loratadine 10 MG tablet Commonly known as:  CLARITIN Take 10 mg by mouth daily.   olopatadine 0.1 % ophthalmic solution Commonly known as:  PATANOL Place 1 drop into both eyes 2 (two) times daily.   Olopatadine HCl 0.7 % Soln Commonly known as:  PAZEO Place 1 drop into both eyes 1 day or 1 dose.   phentermine 37.5 MG tablet Commonly known as:  ADIPEX-P Take 37.5 mg by mouth daily.       No Known Allergies  Review of systems: Review of systems negative except as noted in HPI / PMHx or noted below: Constitutional: Negative.  HENT: Negative.   Eyes: Negative.  Respiratory: Negative.   Cardiovascular: Negative.  Gastrointestinal: Negative.  Genitourinary: Negative.  Musculoskeletal: Negative.  Neurological: Negative.  Endo/Heme/Allergies: Negative.  Cutaneous: Negative.  Past Medical History:  Diagnosis Date  . GERD (gastroesophageal reflux disease)   . Left ovarian cyst     Family History  Problem Relation Age of Onset  . Allergic rhinitis Mother   . Urticaria Mother   . Angioedema Neg Hx   . Asthma Neg Hx   . Atopy Neg Hx   . Eczema Neg Hx   . Immunodeficiency Neg Hx     Social History   Socioeconomic History  . Marital  status: Married    Spouse name: Not on file  . Number of children: Not on file  . Years of education: Not on file  . Highest education level: Not on file  Occupational History  . Not on file  Social Needs  . Financial resource strain: Not on file  . Food insecurity:    Worry: Not on file    Inability: Not on file  . Transportation needs:    Medical: Not on file    Non-medical: Not on file  Tobacco Use  . Smoking status: Never Smoker  . Smokeless tobacco: Never Used  Substance and Sexual Activity  . Alcohol use: Yes    Alcohol/week: 0.0 oz    Comment: occasional  . Drug use: No  . Sexual activity: Not on file  Lifestyle  . Physical activity:    Days per week: Not on file    Minutes per session: Not on file  . Stress: Not  on file  Relationships  . Social connections:    Talks on phone: Not on file    Gets together: Not on file    Attends religious service: Not on file    Active member of club or organization: Not on file    Attends meetings of clubs or organizations: Not on file    Relationship status: Not on file  . Intimate partner violence:    Fear of current or ex partner: Not on file    Emotionally abused: Not on file    Physically abused: Not on file    Forced sexual activity: Not on file  Other Topics Concern  . Not on file  Social History Narrative  . Not on file    I appreciate the opportunity to take part in Amy Gross's care. Please do not hesitate to contact me with questions.  Sincerely,   R. Edgar Frisk, MD

## 2018-03-20 NOTE — Assessment & Plan Note (Signed)
   Proper technique for stanching epistaxis has been discussed and demonstrated.  Nasal saline spray and/or nasal saline gel is recommended to moisturize nasal mucosa.  The use of a cool-mist humidifier during the night is recommended.  During epistaxis, if needed, oxymetazoline (Afrin) nasal sparay may be applied to a cotton ball to help stanch the blood flow.  If this problem persists or progresses, otolaryngology evaluation may be warranted.

## 2018-03-20 NOTE — Assessment & Plan Note (Addendum)
   Continue appropriate allergen avoidance measures.  A refill prescription has been provided for levocetirizine 5 mg daily as needed.  Nasal saline spray (i.e., Simply Saline) or nasal saline lavage (i.e., NeilMed) is recommended as needed.  Consider restarting aeroallergen immunotherapy.

## 2018-03-21 ENCOUNTER — Telehealth: Payer: Self-pay

## 2018-03-21 NOTE — Telephone Encounter (Signed)
Patient is scheduled to start allergy injections on 04/13/18.

## 2018-03-21 NOTE — Telephone Encounter (Signed)
Ok, it looks like I submitted ITx orders in May 2017. Thanks.

## 2018-03-26 ENCOUNTER — Telehealth: Payer: Self-pay | Admitting: Allergy

## 2018-03-26 NOTE — Telephone Encounter (Signed)
Dr. Verlin Fester, Insurance will not pay for Pazeo 0.7%. Will pay for Epinastine HCI, Azelastine HCI, Or Ketotifen Fumarate. Please advise. Thank you

## 2018-03-26 NOTE — Telephone Encounter (Signed)
Epinastine, 1 drop per eye twice daily if needed.

## 2018-03-27 ENCOUNTER — Other Ambulatory Visit: Payer: Self-pay

## 2018-03-27 MED ORDER — EPINASTINE HCL 0.05 % OP SOLN: 1.0000 [drp] | Freq: Two times a day (BID) | OPHTHALMIC | 5 refills | Status: DC

## 2018-03-27 NOTE — Telephone Encounter (Signed)
Done

## 2018-03-27 NOTE — Telephone Encounter (Signed)
RX was sent in for Epinastine.

## 2018-03-28 ENCOUNTER — Telehealth: Payer: Self-pay | Admitting: Allergy and Immunology

## 2018-03-28 NOTE — Telephone Encounter (Signed)
Pt called to see if we had her FMLA forms ready yet [phone].

## 2018-03-28 NOTE — Telephone Encounter (Signed)
FMLA forms on Dr Barnett Hatter desk ready to sign

## 2018-03-29 NOTE — Telephone Encounter (Signed)
Forms have been sent to [facility] office so that Dr. Verlin Fester can sign them and then have them faxed back here. I spoke with Rickard Rhymes, CMA in [facility] office and she will let me know when its done.

## 2018-03-30 ENCOUNTER — Encounter: Payer: Self-pay | Admitting: *Deleted

## 2018-03-30 NOTE — Progress Notes (Signed)
4 VIAL SET MADE. EXP: 03-31-19. HV

## 2018-03-30 NOTE — Telephone Encounter (Signed)
Patient has been made aware that forms are ready to be picked up. I will have copies scanned to chart.

## 2018-04-04 NOTE — Telephone Encounter (Signed)
error 

## 2018-04-13 ENCOUNTER — Ambulatory Visit (INDEPENDENT_AMBULATORY_CARE_PROVIDER_SITE_OTHER): Payer: BLUE CROSS/BLUE SHIELD

## 2018-04-13 DIAGNOSIS — J309 Allergic rhinitis, unspecified: Secondary | ICD-10-CM

## 2018-04-25 ENCOUNTER — Ambulatory Visit (INDEPENDENT_AMBULATORY_CARE_PROVIDER_SITE_OTHER): Payer: BLUE CROSS/BLUE SHIELD

## 2018-04-25 DIAGNOSIS — J309 Allergic rhinitis, unspecified: Secondary | ICD-10-CM

## 2018-04-27 ENCOUNTER — Ambulatory Visit (INDEPENDENT_AMBULATORY_CARE_PROVIDER_SITE_OTHER): Payer: BLUE CROSS/BLUE SHIELD

## 2018-04-27 DIAGNOSIS — J309 Allergic rhinitis, unspecified: Secondary | ICD-10-CM

## 2018-08-29 ENCOUNTER — Emergency Department (HOSPITAL_COMMUNITY)
Admission: EM | Admit: 2018-08-29 | Discharge: 2018-08-29 | Disposition: A | Discharge status: Home / Self Care | Payer: 59 | Attending: Emergency Medicine | Admitting: Emergency Medicine

## 2018-08-29 ENCOUNTER — Encounter (HOSPITAL_COMMUNITY): Payer: Self-pay | Admitting: Emergency Medicine

## 2018-08-29 ENCOUNTER — Emergency Department (HOSPITAL_COMMUNITY): Payer: 59

## 2018-08-29 ENCOUNTER — Other Ambulatory Visit: Payer: Self-pay

## 2018-08-29 DIAGNOSIS — R319 Hematuria, unspecified: Secondary | ICD-10-CM | POA: Diagnosis present

## 2018-08-29 DIAGNOSIS — R1030 Lower abdominal pain, unspecified: Secondary | ICD-10-CM | POA: Diagnosis not present

## 2018-08-29 DIAGNOSIS — R102 Pelvic and perineal pain: Secondary | ICD-10-CM

## 2018-08-29 LAB — URINALYSIS, ROUTINE W REFLEX MICROSCOPIC
Bilirubin Urine: NEGATIVE
Glucose, UA: NEGATIVE mg/dL
Hgb urine dipstick: NEGATIVE
Ketones, ur: NEGATIVE mg/dL
Leukocytes, UA: NEGATIVE
Nitrite: NEGATIVE
Protein, ur: NEGATIVE mg/dL
Specific Gravity, Urine: 1.016 (ref 1.005–1.030)
pH: 5 (ref 5.0–8.0)

## 2018-08-29 LAB — POC URINE PREG, ED: Preg Test, Ur: NEGATIVE

## 2018-08-29 NOTE — ED Provider Notes (Signed)
[facility] DEPT Provider Note   CSN: [id number] Arrival date & time: 08/29/18  1751     History   Chief Complaint Chief Complaint  Patient presents with  . Hematuria    HPI Amyiah Gaba is a 42 y.o. female.  The history is provided by the patient and medical records. No language interpreter was used.   Linnie Delgrande is a 42 y.o. female who presents to the Emergency Department complaining of right lower back pain and suprapubic pain x 3 weeks.  At onset of symptoms, she missed her period.  She went to her OB/GYN where she had a pregnancy test which was negative.  She still is experiencing suprapubic and low back pain last week, therefore she went back to her OB/GYN where she had pelvic examination with full STI testing which was all negative per patient.  Over the last week, she has had some spotting.  She has noticed blood only with wiping.  She went back to her OB/GYN 2 days ago who did a UA.  She said that they had blood in the urine and were concerned for infection, therefore sent her urine for a culture and started her on Bactrim.  Culture should result today per patient.  She is also scheduled for a pelvic ultrasound on Monday.  She states that if ultrasound is normal and she has no improvement with antibiotics, her doctor was thinking about getting a scan.  She has been taking Aleve with some improvement.  No fever or chills. Patient denies upper back or neck pain. No saddle anesthesia, weakness, numbness, urinary complaints including retention/incontinence. No history of cancer, IVDU, or recent spinal procedures.  Past Medical History:  Diagnosis Date  . GERD (gastroesophageal reflux disease)   . Left ovarian cyst     Patient Active Problem List   Diagnosis Date Noted  . Epistaxis 03/20/2018  . Allergic contact dermatitis due to plants 03/20/2018  . Viral URI 02/20/2017  . Allergic urticaria 02/20/2017  . Allergic reaction 03/09/2016  .  Perennial and seasonal allergic rhinitis 03/09/2016  . Seasonal allergic conjunctivitis 03/09/2016    Past Surgical History:  Procedure Laterality Date  . LAPAROSCOPIC OVARIAN CYSTECTOMY Right 12/29/2015   Procedure: LAPAROSCOPIC RIGHT OVARIAN CYSTECTOMY; CHROMOTUBATION; LYSIS OF ADHESIONS; EXCISION AND ABLATION OF ENDOMETRIOSIS;  Surgeon: Governor Specking, MD;  Location: [place];  Service: Gynecology;  Laterality: Right;  . MICROTUBOPLASTY Bilateral March 2016  . TUBAL LIGATION  2009     OB History   None      Home Medications    Prior to Admission medications   Medication Sig Start Date End Date Taking? Authorizing Provider  diphenhydrAMINE (BENADRYL) 25 mg capsule Take 50 mg by mouth every 6 (six) hours as needed for itching.    [provider]  Epinastine HCl 0.05 % ophthalmic solution Place 1 drop into both eyes 2 (two) times daily. 03/27/18   Bobbitt, Sedalia Muta, MD  EPINEPHrine 0.3 mg/0.3 mL IJ SOAJ injection Inject 0.3 mLs (0.3 mg total) into the muscle once. 03/09/16   Bobbitt, Sedalia Muta, MD  fluticasone (FLONASE) 50 MCG/ACT nasal spray Place 2 sprays into both nostrils daily. Patient not taking: Reported on 02/20/2017 03/09/16   Bobbitt, Sedalia Muta, MD  levocetirizine (XYZAL) 5 MG tablet Take 1 tablet (5 mg total) by mouth every evening. 03/20/18   Bobbitt, Sedalia Muta, MD  loratadine (CLARITIN) 10 MG tablet Take 10 mg by mouth daily.    [provider]  olopatadine (PATANOL) 0.1 % ophthalmic solution Place 1 drop into both eyes 2 (two) times daily. Patient not taking: Reported on 03/20/2018 03/09/16   Bobbitt, Sedalia Muta, MD  Olopatadine HCl (PAZEO) 0.7 % SOLN Place 1 drop into both eyes 1 day or 1 dose. 03/20/18   Bobbitt, Sedalia Muta, MD  phentermine (ADIPEX-P) 37.5 MG tablet Take 37.5 mg by mouth daily. 02/16/18   [provider]    Family History Family History  Problem Relation Age of Onset  . Allergic rhinitis Mother     . Urticaria Mother   . Angioedema Neg Hx   . Asthma Neg Hx   . Atopy Neg Hx   . Eczema Neg Hx   . Immunodeficiency Neg Hx     Social History Social History   Tobacco Use  . Smoking status: Never Smoker  . Smokeless tobacco: Never Used  Substance Use Topics  . Alcohol use: Yes    Alcohol/week: 0.0 standard drinks    Comment: occasional  . Drug use: No     Allergies   Amoxicillin   Review of Systems Review of Systems  Gastrointestinal: Positive for abdominal pain. Negative for blood in stool, constipation, diarrhea, nausea and vomiting.  Genitourinary: Positive for dysuria.  Musculoskeletal: Positive for back pain. Negative for neck pain.  All other systems reviewed and are negative.    Physical Exam Updated Vital Signs BP 128/66   Pulse 87   Temp 99.9 F (37.7 C) (Oral)   Resp 16   SpO2 100%   Physical Exam  Constitutional: She is oriented to person, place, and time. She appears well-developed and well-nourished. No distress.  HENT:  Head: Normocephalic and atraumatic.  Cardiovascular: Normal rate, regular rhythm and normal heart sounds.  No murmur heard. Pulmonary/Chest: Effort normal and breath sounds normal. No respiratory distress.  Abdominal: Soft. She exhibits no distension. There is tenderness (Suprapubic). There is no rebound and no guarding.  Musculoskeletal:  No CVA tenderness or T/L midline tenderness.  Neurological: She is alert and oriented to person, place, and time.  Skin: Skin is warm and dry.  Nursing note and vitals reviewed.     ED Treatments / Results  Labs (all labs ordered are listed, but only abnormal results are displayed) Labs Reviewed  URINALYSIS, ROUTINE W REFLEX MICROSCOPIC  POC URINE PREG, ED    EKG None  Radiology Ct Renal Stone Study  Result Date: 08/29/2018 CLINICAL DATA:  Right-sided flank and abdominal pain. EXAM: CT ABDOMEN AND PELVIS WITHOUT CONTRAST TECHNIQUE: Multidetector CT imaging of the abdomen and  pelvis was performed following the standard protocol without IV contrast. COMPARISON:  Abdominal ultrasound-08/03/2015 FINDINGS: The lack of intravenous contrast limits the ability to evaluate solid abdominal organs. Lower chest: Limited visualization of the lower thorax is negative for focal airspace opacity or pleural effusion. Normal heart size. Small amount of pericardial fluid, presumably physiologic. Hepatobiliary: Normal hepatic contour. Normal noncontrast appearance of the gallbladder given degree of distention. No radiopaque gallstones or biliary sludge. No ascites. Pancreas: Normal noncontrast appearance of the pancreas Spleen: Normal noncontrast appearance the spleen Adrenals/Urinary Tract: There is a suspected approximately 0.7 cm hypoattenuating lesion with the posterior interpolar aspect the right kidney (image 22, series 2), incompletely characterized on this noncontrast examination. There is focal outpouching involving the peripheral interpolar aspect the left kidney (coronal image 102, series 4), without discrete associated lesion on this noncontrast examination. No renal stones. No renal stones are seen along the expected course of either ureter or  the urinary bladder. Normal noncontrast appearance of the urinary bladder given underdistention. No urinary obstruction or perinephric stranding. Normal noncontrast appearance the bilateral adrenal glands. Stomach/Bowel: Moderate colonic stool burden without evidence of enteric obstruction. Normal noncontrast appearance of the terminal ileum and retrocecal appendix. Scattered diverticuli within the cecum and proximal ascending colon without evidence acute diverticulitis on this noncontrast examination. No pneumoperitoneum, pneumatosis or portal venous gas. Vascular/Lymphatic: No evidence of abdominal aortic aneurysm. No bulky retroperitoneal, mesenteric, pelvic or inguinal lymphadenopathy on this noncontrast examination. Reproductive: Small amount of  presumably physiologic fluid within the endometrial canal. There is a suspected approximately 2.9 x 2.4 cm hypoattenuating left-sided presumably physiologic adnexal cyst. No discrete right-sided adnexal lesion. No free fluid the pelvic cul-de-sac. Other: Tiny mesenteric fat containing periumbilical hernia. Musculoskeletal: No acute or aggressive osseous abnormalities. IMPRESSION: 1. No explanation for patient's right-sided flank pain. Specifically, no evidence of nephrolithiasis, urinary or enteric obstruction. 2. Scattered colonic diverticulosis, primarily involving the cecum and proximal ascending colon, without evidence of superimposed acute diverticulitis on this noncontrast examination. 3. Incidentally noted approximately 2.9 cm presumably physiologic left-sided adnexal cyst. Electronically Signed   By: Sandi Mariscal M.D.   On: 08/29/2018 10:37    Procedures Procedures (including critical care time)  Medications Ordered in ED Medications - No data to display   Initial Impression / Assessment and Plan / ED Course  I have reviewed the triage vital signs and the nursing notes.  Pertinent labs & imaging results that were available during my care of the patient were reviewed by me and considered in my medical decision making (see chart for details).    Eldana Isip is a 42 y.o. female who presents to ED for assistant suprapubic pain and right sided back pain over the last 3 weeks.  She has been followed closely by her OB/GYN who has performed STI testing which were all negative.  They also started her on Bactrim which she began taking yesterday morning.  Her urine was cultured and she was told that she would have culture results today.  She is scheduled for an outpatient ultrasound as well.  On exam, she is afebrile, hemodynamically stable with no peritoneal signs on exam.  She does have some mild suprapubic tenderness as well as flank tenderness.  No focal areas of tenderness at McBurney's point to  suggest appendicitis.  Given persistent pain, will obtain CT renal study to ensure there is not a kidney stone causing her discomfort.  CT was negative for pathology on the right.  She does have a left adnexal cyst noted, however her OB/GYN is aware of this and this is unlikely related to today's symptoms.Evaluation does not show pathology that would require ongoing emergent intervention or inpatient treatment.  We will have her continue her course of antibiotics and follow-up with OB/GYN as she has been instructed to do.  Reasons to return to the ER were discussed and all questions answered.  Patient seen by and discussed with Dr. Sedonia Small who agrees with treatment plan.    Final Clinical Impressions(s) / ED Diagnoses   Final diagnoses:  Suprapubic pain    ED Discharge Orders    None       Min Tunnell, Ozella Almond, PA-C 08/29/18 1130    Maudie Flakes, MD 08/29/18 1555

## 2018-08-29 NOTE — ED Triage Notes (Signed)
Pt presents with right flank pain and hematuria with known treatment for UTI.

## 2018-08-29 NOTE — Discharge Instructions (Signed)
It was my pleasure taking care of you today!   Please continue taking your antibiotics until completed.   Follow up with your OBGYN as scheduled.   Return to ER for fever, vomiting, new or worsening symptoms, any additional concerns.

## 2018-08-29 NOTE — ED Notes (Signed)
Grey tube sent to lab with urine sample.

## 2018-08-29 NOTE — ED Notes (Signed)
Patient verbalized understanding of discharge instructions, no questions. Patient ambulated out of ED with steady gait in no distress.  

## 2018-09-05 ENCOUNTER — Inpatient Hospital Stay (HOSPITAL_COMMUNITY)
Admission: AD | Admit: 2018-09-05 | Discharge: 2018-09-05 | Disposition: A | Discharge status: Home / Self Care | Payer: 59 | Source: Ambulatory Visit | Attending: Obstetrics and Gynecology | Admitting: Obstetrics and Gynecology

## 2018-09-05 ENCOUNTER — Other Ambulatory Visit: Payer: Self-pay

## 2018-09-05 DIAGNOSIS — Z880 Allergy status to penicillin: Secondary | ICD-10-CM | POA: Diagnosis not present

## 2018-09-05 DIAGNOSIS — R102 Pelvic and perineal pain: Secondary | ICD-10-CM | POA: Diagnosis not present

## 2018-09-05 DIAGNOSIS — M549 Dorsalgia, unspecified: Secondary | ICD-10-CM | POA: Diagnosis present

## 2018-09-05 DIAGNOSIS — K219 Gastro-esophageal reflux disease without esophagitis: Secondary | ICD-10-CM | POA: Diagnosis not present

## 2018-09-05 DIAGNOSIS — R109 Unspecified abdominal pain: Secondary | ICD-10-CM | POA: Diagnosis present

## 2018-09-05 DIAGNOSIS — N920 Excessive and frequent menstruation with regular cycle: Secondary | ICD-10-CM | POA: Insufficient documentation

## 2018-09-05 DIAGNOSIS — Z79899 Other long term (current) drug therapy: Secondary | ICD-10-CM | POA: Insufficient documentation

## 2018-09-05 LAB — URINALYSIS, ROUTINE W REFLEX MICROSCOPIC
Bacteria, UA: NONE SEEN
Bacteria, UA: NONE SEEN
Bilirubin Urine: NEGATIVE
Bilirubin Urine: NEGATIVE
Glucose, UA: NEGATIVE mg/dL
Glucose, UA: NEGATIVE mg/dL
Ketones, ur: NEGATIVE mg/dL
Ketones, ur: NEGATIVE mg/dL
Leukocytes, UA: NEGATIVE
Leukocytes, UA: NEGATIVE
Nitrite: NEGATIVE
Nitrite: NEGATIVE
Protein, ur: NEGATIVE mg/dL
Protein, ur: NEGATIVE mg/dL
Specific Gravity, Urine: 1.015 (ref 1.005–1.030)
Specific Gravity, Urine: 1.018 (ref 1.005–1.030)
pH: 6 (ref 5.0–8.0)
pH: 6 (ref 5.0–8.0)

## 2018-09-05 LAB — POCT PREGNANCY, URINE: Preg Test, Ur: NEGATIVE

## 2018-09-05 MED ORDER — HYDROCODONE-ACETAMINOPHEN 5-325 MG PO TABS: 1.0000 | ORAL_TABLET | Freq: Four times a day (QID) | ORAL | 0 refills | Status: DC | PRN

## 2018-09-05 MED ORDER — IBUPROFEN 800 MG PO TABS: 800.0000 mg | ORAL_TABLET | Freq: Three times a day (TID) | ORAL | 7 refills | Status: AC | PRN

## 2018-09-05 NOTE — MAU Provider Note (Signed)
History     Chief Complaint  Patient presents with  . Abdominal Pain  . Back Pain    42 yo BF Presents with c/o heavy menstrual cycle and left sided abdominal pain. Pt had sonogram appt in office on Monday which she cancelled. Per pt had prior cycle 10/3 which had mainly clots and was light then this cycle. Previous cycles have been normal.  Pt has been using Aleve and last taken dose at 5 am  OB History   None     Past Medical History:  Diagnosis Date  . GERD (gastroesophageal reflux disease)   . Left ovarian cyst     Past Surgical History:  Procedure Laterality Date  . LAPAROSCOPIC OVARIAN CYSTECTOMY Right 12/29/2015   Procedure: LAPAROSCOPIC RIGHT OVARIAN CYSTECTOMY; CHROMOTUBATION; LYSIS OF ADHESIONS; EXCISION AND ABLATION OF ENDOMETRIOSIS;  Surgeon: Governor Specking, MD;  Location: [place];  Service: Gynecology;  Laterality: Right;  . MICROTUBOPLASTY Bilateral March 2016  . TUBAL LIGATION  2009    Family History  Problem Relation Age of Onset  . Allergic rhinitis Mother   . Urticaria Mother   . Angioedema Neg Hx   . Asthma Neg Hx   . Atopy Neg Hx   . Eczema Neg Hx   . Immunodeficiency Neg Hx     Social History   Tobacco Use  . Smoking status: Never Smoker  . Smokeless tobacco: Never Used  Substance Use Topics  . Alcohol use: Yes    Alcohol/week: 0.0 standard drinks    Comment: occasional  . Drug use: No    Allergies:  Allergies  Allergen Reactions  . Amoxicillin     Medications Prior to Admission  Medication Sig Dispense Refill Last Dose  . diphenhydrAMINE (BENADRYL) 25 mg capsule Take 50 mg by mouth every 6 (six) hours as needed for itching.   Taking  . Epinastine HCl 0.05 % ophthalmic solution Place 1 drop into both eyes 2 (two) times daily. 5 mL 5   . EPINEPHrine 0.3 mg/0.3 mL IJ SOAJ injection Inject 0.3 mLs (0.3 mg total) into the muscle once. 2 Device 1 Taking  . fluticasone (FLONASE) 50 MCG/ACT nasal spray Place 2 sprays  into both nostrils daily. (Patient not taking: Reported on 02/20/2017) 16 g 5 Not Taking  . levocetirizine (XYZAL) 5 MG tablet Take 1 tablet (5 mg total) by mouth every evening. 30 tablet 5   . loratadine (CLARITIN) 10 MG tablet Take 10 mg by mouth daily.   Taking  . olopatadine (PATANOL) 0.1 % ophthalmic solution Place 1 drop into both eyes 2 (two) times daily. (Patient not taking: Reported on 03/20/2018) 5 mL 5 Not Taking  . Olopatadine HCl (PAZEO) 0.7 % SOLN Place 1 drop into both eyes 1 day or 1 dose. 1 Bottle 5   . phentermine (ADIPEX-P) 37.5 MG tablet Take 37.5 mg by mouth daily.  0      Physical Exam   Blood pressure 134/81, pulse 74, temperature 98.7 F (37.1 C), resp. rate 18, last menstrual period 09/02/2018, SpO2 99 %.  General appearance: alert, cooperative and no distress Lungs: clear to auscultation bilaterally Heart: regular rate and rhythm, S1, S2 normal, no murmur, click, rub or gallop Abdomen: soft obese non tender Pelvic: cervix normal in appearance, external genitalia normal, no adnexal masses or tenderness, uterus normal size, shape, and consistency and vagina: blood in vault Extremities: no edema, redness or tenderness in the calves or thighs ED Course  Frequent or excessive menstruation  Pelvic pain P) d/c home. Pt to call office for sonogram appt d7-10 of cycle. vicodin script given MDM   Marvene Staff, MD 9:18 AM 09/05/2018

## 2018-09-05 NOTE — MAU Note (Signed)
Pt is c/o abnormal heavy bleeding and cramping.  Today cramping and bleeding has increased.  Pt has been followed for abnormal bleeding and back pain.  Pt concerned today that bleeding and cramping is worse.

## 2018-09-05 NOTE — MAU Note (Signed)
Pt presents to MAU with complaints of lower abdominal cramping, back pain. States she was suppose to have an U/S yesterday at the office but had to cancel due to having too much vaginal bleeding.

## 2018-11-23 ENCOUNTER — Other Ambulatory Visit: Payer: Self-pay | Admitting: Obstetrics and Gynecology

## 2019-07-19 ENCOUNTER — Telehealth: Payer: Self-pay | Admitting: *Deleted

## 2019-07-19 NOTE — Telephone Encounter (Signed)
OPEN ERROR  I

## 2019-07-19 NOTE — Telephone Encounter (Signed)
PHONE BUSY- WILL TRYAGAIN 

## 2019-07-19 NOTE — Telephone Encounter (Signed)
Patient has upcoming gxt schedule for 07/23/19 - 9:45- patient needs an covid test  RN MADE AWARE BY  NUCLEAR DEPARTMENT -  CALLED PATIENT  - BUSY    WILL  CALL BACK TO BACK TO GIVE PATIENT INSTRUCTIONS ,   PATIENT ALSO NEEDS A FOLLOW  UP APPOINTMENT

## 2019-07-25 ENCOUNTER — Other Ambulatory Visit: Payer: Self-pay

## 2019-07-25 DIAGNOSIS — Z20822 Contact with and (suspected) exposure to covid-19: Secondary | ICD-10-CM

## 2019-07-26 ENCOUNTER — Telehealth: Payer: Self-pay | Admitting: General Practice

## 2019-07-26 LAB — NOVEL CORONAVIRUS, NAA: SARS-CoV-2, NAA: NOT DETECTED

## 2019-07-26 NOTE — Telephone Encounter (Signed)
Negative COVID results given. Patient results "NOT Detected." Caller expressed understanding. ° °

## 2019-08-06 IMAGING — CT CT RENAL STONE PROTOCOL
2 of 4 series · 15 of 46 positions shown, 17 images · non-contrast
Comparison: Abdominal ultrasound-08/03/2015

CLINICAL DATA: Right-sided flank and abdominal pain.

EXAM:
CT ABDOMEN AND PELVIS WITHOUT CONTRAST
TECHNIQUE: Multidetector CT imaging of the abdomen and pelvis was performed
following the standard protocol without IV contrast.

[Series 2: axial st · axial · 0.85mm/px · z∈[+1029,+1424]mm · 12 of 89 slices shown, 14 images]
[im 5/89  soft-tissue]
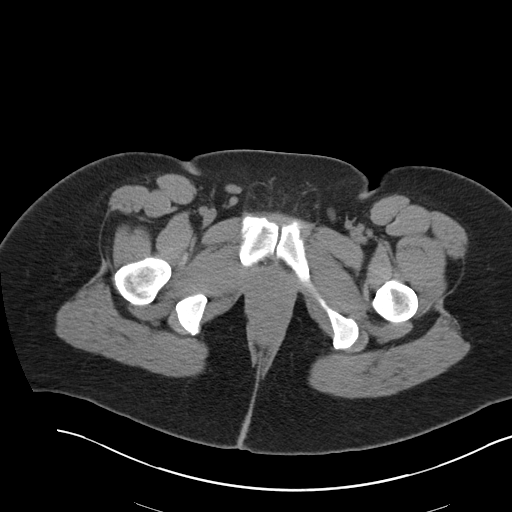
[im 5/89  bone]
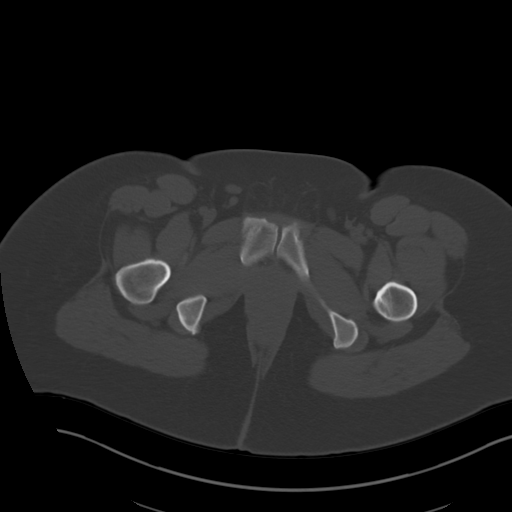
[im 14/89  soft-tissue]
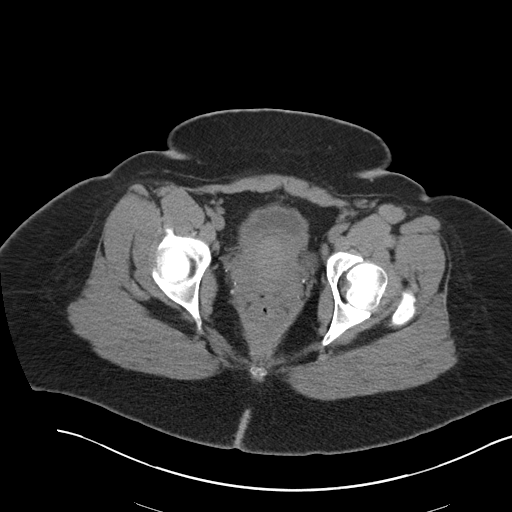
[im 19/89  soft-tissue]
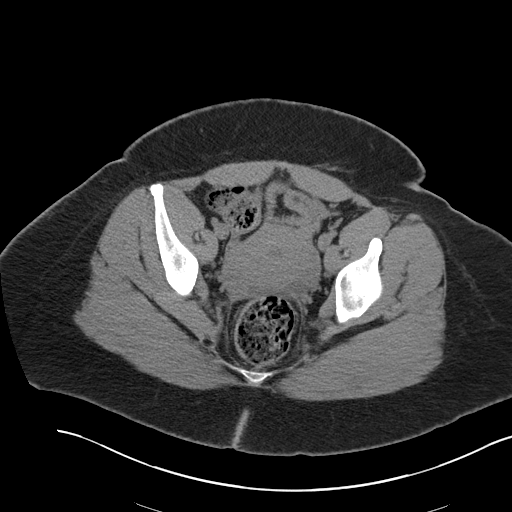
[im 28/89  soft-tissue]
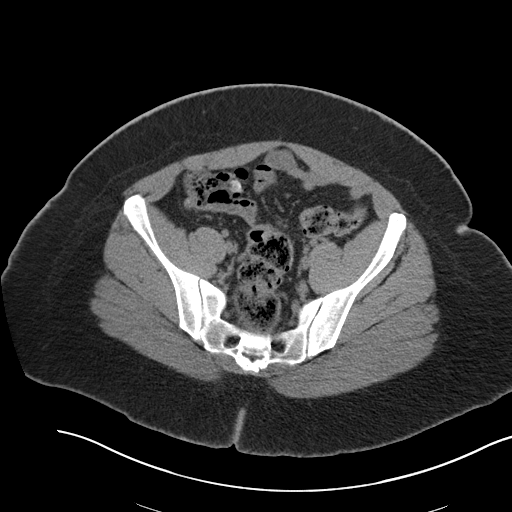
[im 33/89  soft-tissue]
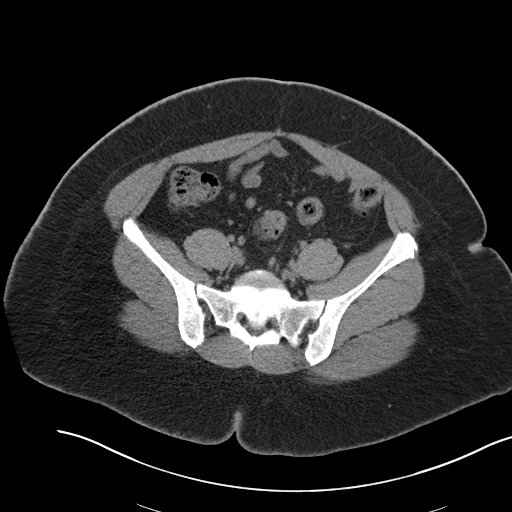
[im 42/89  soft-tissue]
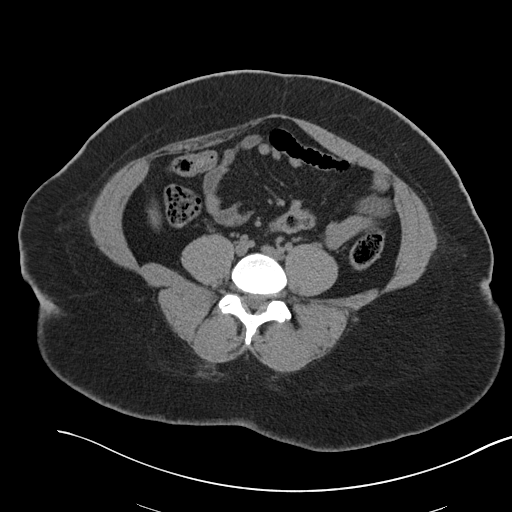
[im 47/89  soft-tissue]
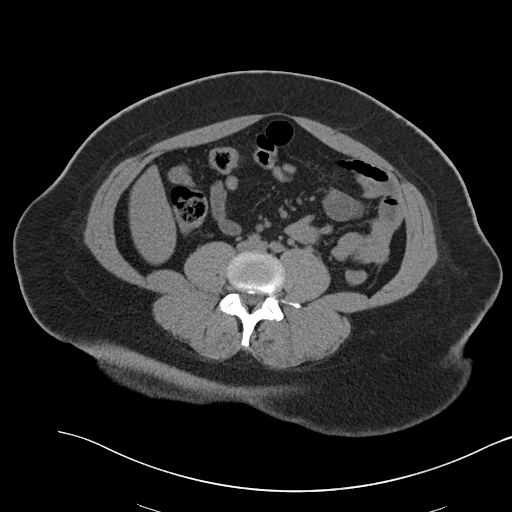
[im 56/89  soft-tissue]
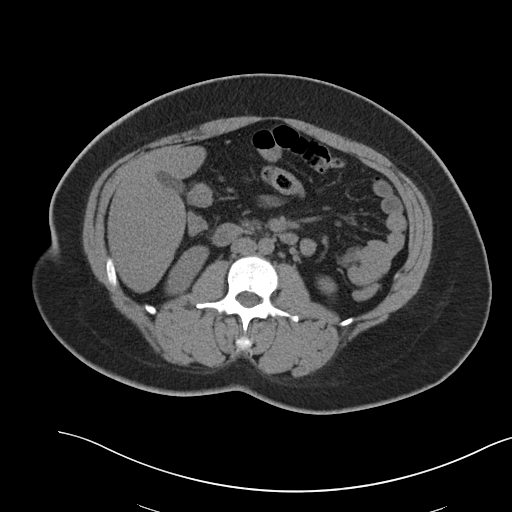
[im 61/89  soft-tissue]
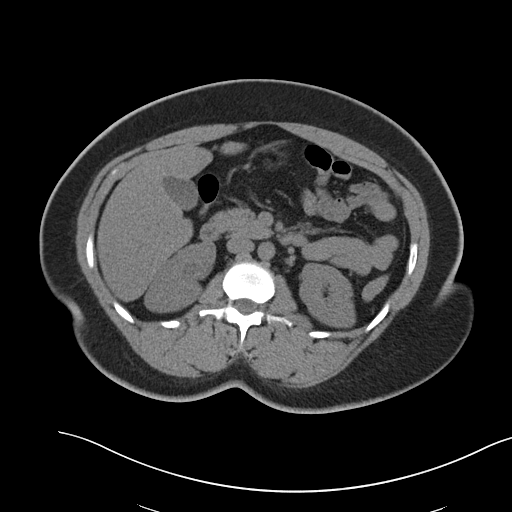
[im 61/89  bone]
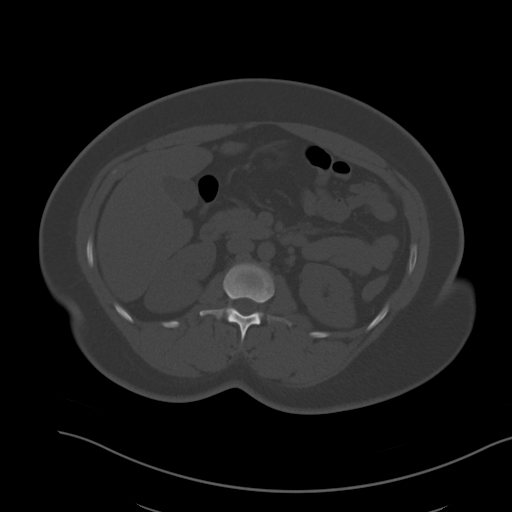
[im 70/89  soft-tissue]
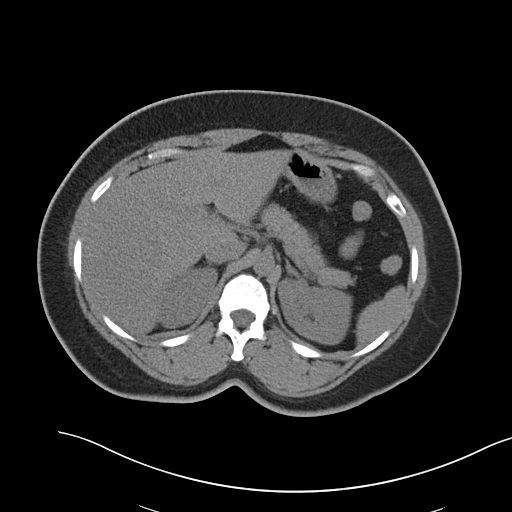
[im 75/89  soft-tissue]
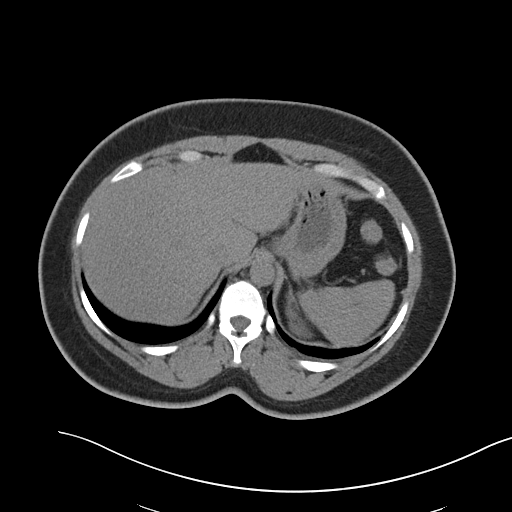
[im 84/89  soft-tissue]
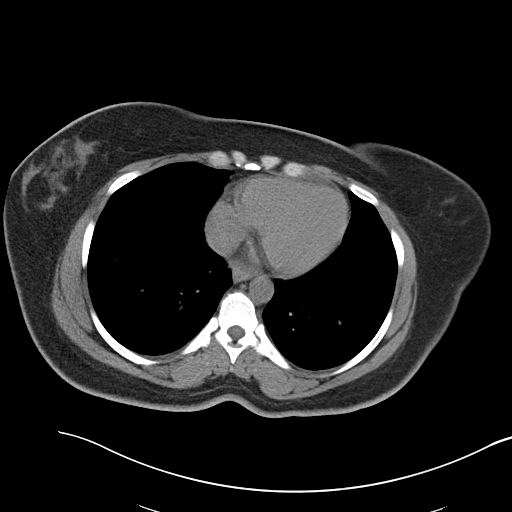

[Series 4: coronal · coronal · 0.83mm/px · 3 of 163 slices shown]
[im 55/163  soft-tissue]
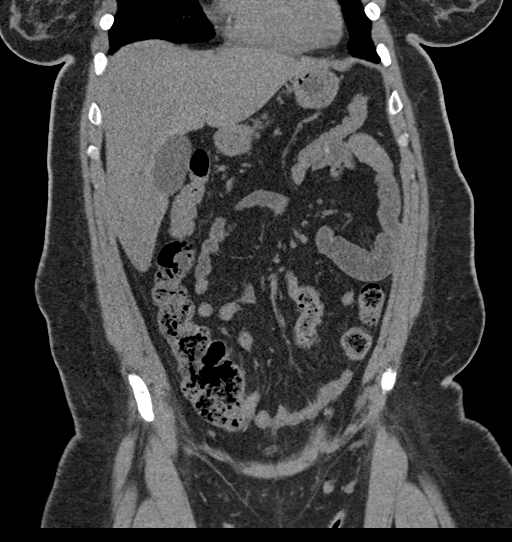
[im 73/163  soft-tissue]
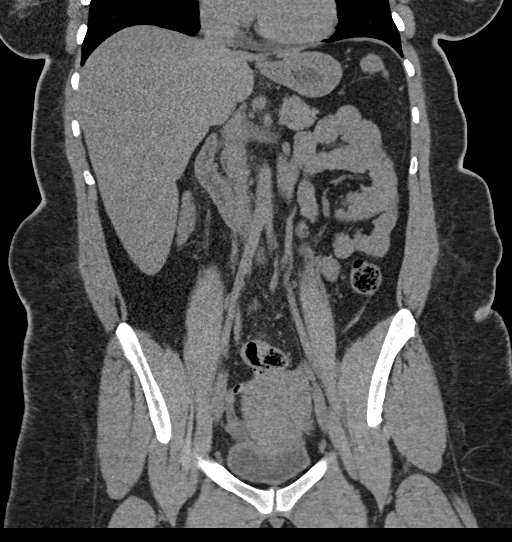
[im 91/163  soft-tissue]
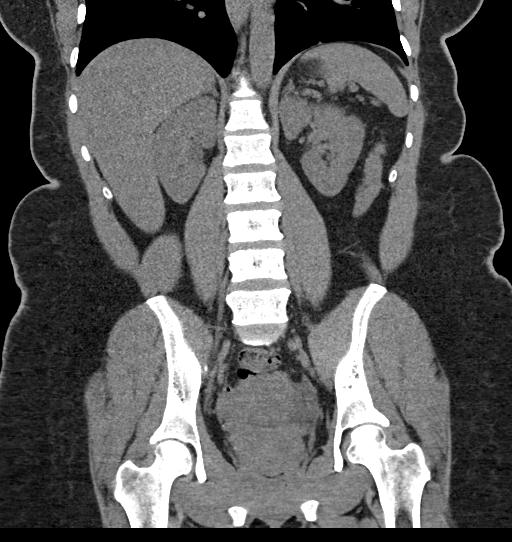

[15 of 46 positions shown; findings below may reference images not displayed]

FINDINGS: The lack of intravenous contrast limits the ability to evaluate
solid abdominal organs.

Lower chest: Limited visualization of the lower thorax is negative
for focal airspace opacity or pleural effusion.

Normal heart size. Small amount of pericardial fluid, presumably
physiologic.

Hepatobiliary: Normal hepatic contour. Normal noncontrast appearance
of the gallbladder given degree of distention. No radiopaque
gallstones or biliary sludge. No ascites.

Pancreas: Normal noncontrast appearance of the pancreas

Spleen: Normal noncontrast appearance the spleen

Adrenals/Urinary Tract: There is a suspected approximately 0.7 cm
hypoattenuating lesion with the posterior interpolar aspect the
right kidney (image 22, series 2), incompletely characterized on
this noncontrast examination. There is focal outpouching involving
the peripheral interpolar aspect the left kidney (coronal image 102,
series 4), without discrete associated lesion on this noncontrast
examination.

No renal stones. No renal stones are seen along the expected course
of either ureter or the urinary bladder. Normal noncontrast
appearance of the urinary bladder given underdistention. No urinary
obstruction or perinephric stranding.

Normal noncontrast appearance the bilateral adrenal glands.

Stomach/Bowel: Moderate colonic stool burden without evidence of
enteric obstruction. Normal noncontrast appearance of the terminal
ileum and retrocecal appendix.

Scattered diverticuli within the cecum and proximal ascending colon
without evidence acute diverticulitis on this noncontrast
examination.

No pneumoperitoneum, pneumatosis or portal venous gas.

Vascular/Lymphatic: No evidence of abdominal aortic aneurysm.

No bulky retroperitoneal, mesenteric, pelvic or inguinal
lymphadenopathy on this noncontrast examination.

Reproductive: Small amount of presumably physiologic fluid within
the endometrial canal. There is a suspected approximately 2.9 x
cm hypoattenuating left-sided presumably physiologic adnexal cyst.
No discrete right-sided adnexal lesion. No free fluid the pelvic
cul-de-sac.

Other: Tiny mesenteric fat containing periumbilical hernia.

Musculoskeletal: No acute or aggressive osseous abnormalities.
IMPRESSION: 1. No explanation for patient's right-sided flank pain.
Specifically, no evidence of nephrolithiasis, urinary or enteric
obstruction.
2. Scattered colonic diverticulosis, primarily involving the cecum
and proximal ascending colon, without evidence of superimposed acute
diverticulitis on this noncontrast examination.
3. Incidentally noted approximately 2.9 cm presumably physiologic
left-sided adnexal cyst.

## 2020-08-01 ENCOUNTER — Other Ambulatory Visit: Payer: Self-pay

## 2020-08-01 ENCOUNTER — Encounter (HOSPITAL_COMMUNITY): Payer: Self-pay

## 2020-08-01 ENCOUNTER — Emergency Department (HOSPITAL_COMMUNITY): Payer: 59

## 2020-08-01 ENCOUNTER — Emergency Department (HOSPITAL_COMMUNITY)
Admission: EM | Admit: 2020-08-01 | Discharge: 2020-08-01 | Disposition: A | Discharge status: Home / Self Care | Payer: 59 | Attending: Emergency Medicine | Admitting: Emergency Medicine

## 2020-08-01 DIAGNOSIS — E876 Hypokalemia: Secondary | ICD-10-CM | POA: Diagnosis not present

## 2020-08-01 DIAGNOSIS — R7989 Other specified abnormal findings of blood chemistry: Secondary | ICD-10-CM

## 2020-08-01 DIAGNOSIS — R000 Tachycardia, unspecified: Secondary | ICD-10-CM | POA: Diagnosis not present

## 2020-08-01 DIAGNOSIS — Z79899 Other long term (current) drug therapy: Secondary | ICD-10-CM | POA: Insufficient documentation

## 2020-08-01 DIAGNOSIS — M549 Dorsalgia, unspecified: Secondary | ICD-10-CM | POA: Diagnosis not present

## 2020-08-01 DIAGNOSIS — R300 Dysuria: Secondary | ICD-10-CM | POA: Diagnosis present

## 2020-08-01 DIAGNOSIS — N12 Tubulo-interstitial nephritis, not specified as acute or chronic: Secondary | ICD-10-CM | POA: Diagnosis not present

## 2020-08-01 DIAGNOSIS — R809 Proteinuria, unspecified: Secondary | ICD-10-CM

## 2020-08-01 LAB — CBC WITH DIFFERENTIAL/PLATELET
Abs Immature Granulocytes: 0.25 10*3/uL — ABNORMAL HIGH (ref 0.00–0.07)
Basophils Absolute: 0 10*3/uL (ref 0.0–0.1)
Basophils Relative: 0 %
Eosinophils Absolute: 0.1 10*3/uL (ref 0.0–0.5)
Eosinophils Relative: 0 %
HCT: 35.8 % — ABNORMAL LOW (ref 36.0–46.0)
Hemoglobin: 11.5 g/dL — ABNORMAL LOW (ref 12.0–15.0)
Immature Granulocytes: 1 %
Lymphocytes Relative: 8 %
Lymphs Abs: 1.4 10*3/uL (ref 0.7–4.0)
MCH: 28.9 pg (ref 26.0–34.0)
MCHC: 32.1 g/dL (ref 30.0–36.0)
MCV: 89.9 fL (ref 80.0–100.0)
Monocytes Absolute: 1.3 10*3/uL — ABNORMAL HIGH (ref 0.1–1.0)
Monocytes Relative: 7 %
Neutro Abs: 15 10*3/uL — ABNORMAL HIGH (ref 1.7–7.7)
Neutrophils Relative %: 84 %
Platelets: 246 10*3/uL (ref 150–400)
RBC: 3.98 MIL/uL (ref 3.87–5.11)
RDW: 13.8 % (ref 11.5–15.5)
WBC: 18 10*3/uL — ABNORMAL HIGH (ref 4.0–10.5)
nRBC: 0 % (ref 0.0–0.2)

## 2020-08-01 LAB — URINALYSIS, ROUTINE W REFLEX MICROSCOPIC
Bacteria, UA: NONE SEEN
Bilirubin Urine: NEGATIVE
Glucose, UA: NEGATIVE mg/dL
Ketones, ur: 20 mg/dL — AB
Nitrite: NEGATIVE
Protein, ur: >=300 mg/dL — AB
RBC / HPF: >50 RBC/hpf — ABNORMAL HIGH (ref 0–5)
Specific Gravity, Urine: 1.023 (ref 1.005–1.030)
WBC, UA: >50 WBC/hpf — ABNORMAL HIGH (ref 0–5)
pH: 5 (ref 5.0–8.0)

## 2020-08-01 LAB — COMPREHENSIVE METABOLIC PANEL
ALT: 41 U/L (ref 0–44)
AST: 33 U/L (ref 15–41)
Albumin: 4.1 g/dL (ref 3.5–5.0)
Alkaline Phosphatase: 117 U/L (ref 38–126)
Anion gap: 15 (ref 5–15)
BUN: 12 mg/dL (ref 6–20)
CO2: 21 mmol/L — ABNORMAL LOW (ref 22–32)
Calcium: 9.3 mg/dL (ref 8.9–10.3)
Chloride: 103 mmol/L (ref 98–111)
Creatinine, Ser: 1.09 mg/dL — ABNORMAL HIGH (ref 0.44–1.00)
GFR calc Af Amer: >60 mL/min (ref >60)
GFR calc non Af Amer: >60 mL/min (ref >60)
Glucose, Bld: 132 mg/dL — ABNORMAL HIGH (ref 70–99)
Potassium: 3 mmol/L — ABNORMAL LOW (ref 3.5–5.1)
Sodium: 139 mmol/L (ref 135–145)
Total Bilirubin: 0.9 mg/dL (ref 0.3–1.2)
Total Protein: 8.3 g/dL — ABNORMAL HIGH (ref 6.5–8.1)

## 2020-08-01 LAB — PROTIME-INR
INR: 1.1 (ref 0.8–1.2)
Prothrombin Time: 13.6 s (ref 11.4–15.2)

## 2020-08-01 LAB — HCG, QUANTITATIVE, PREGNANCY: hCG, Beta Chain, Quant, S: 1 m[IU]/mL (ref <5)

## 2020-08-01 LAB — LACTIC ACID, PLASMA: Lactic Acid, Venous: 0.8 mmol/L (ref 0.5–1.9)

## 2020-08-01 LAB — APTT: aPTT: 29 s (ref 24–36)

## 2020-08-01 MED ORDER — CEPHALEXIN 500 MG PO CAPS: 500.0000 mg | ORAL_CAPSULE | Freq: Four times a day (QID) | ORAL | 0 refills | Status: AC

## 2020-08-01 MED ORDER — PHENAZOPYRIDINE HCL 200 MG PO TABS: 200.0000 mg | ORAL_TABLET | Freq: Three times a day (TID) | ORAL | 0 refills | Status: AC

## 2020-08-01 MED ORDER — PHENAZOPYRIDINE HCL 100 MG PO TABS
100.0000 mg | ORAL_TABLET | Freq: Three times a day (TID) | ORAL | Status: DC
  Administered 2020-08-01: 100 mg via ORAL
  Filled 2020-08-01: qty 1

## 2020-08-01 MED ORDER — POTASSIUM CHLORIDE ER 10 MEQ PO TBCR: 10.0000 meq | EXTENDED_RELEASE_TABLET | Freq: Every day | ORAL | 0 refills | Status: AC

## 2020-08-01 MED ORDER — SODIUM CHLORIDE 0.9 % IV SOLN
1.0000 g | Freq: Once | INTRAVENOUS | Status: AC
  Administered 2020-08-01: 1 g via INTRAVENOUS
  Filled 2020-08-01: qty 10

## 2020-08-01 MED ORDER — HYDROCODONE-ACETAMINOPHEN 5-325 MG PO TABS
2.0000 | ORAL_TABLET | Freq: Once | ORAL | Status: AC
  Administered 2020-08-01: 2 via ORAL
  Filled 2020-08-01: qty 2

## 2020-08-01 MED ORDER — POTASSIUM CHLORIDE CRYS ER 20 MEQ PO TBCR
40.0000 meq | EXTENDED_RELEASE_TABLET | Freq: Once | ORAL | Status: AC
  Administered 2020-08-01: 40 meq via ORAL
  Filled 2020-08-01: qty 2

## 2020-08-01 MED ORDER — LACTATED RINGERS IV SOLN: INTRAVENOUS | Status: DC

## 2020-08-01 NOTE — Discharge Instructions (Addendum)
1. Medications: keflex; pyridium, usual home medications 2. Treatment: rest, drink plenty of fluids, take medications as prescribed 3. Follow Up: Please followup with your primary doctor in 5-7 days for discussion of your diagnoses and further evaluation after today's visit; return to the ER for fevers, persistent vomiting, worsening abdominal pain or other concerning symptoms.

## 2020-08-01 NOTE — ED Provider Notes (Signed)
[facility] DEPT Provider Note   CSN: [id number] Arrival date & time: 08/01/20  0134     History Chief Complaint  Patient presents with  . Dysuria    Amy Gross is a 44 y.o. female with a hx of GERD, ovarian cyst presents to the Emergency Department complaining of gradual, persistent, progressively worsening dysuria onset several days ago. Associated symptoms include urinary urgency, urinary frequency and hematuria.  Patient reports taking an Azo-like medication which she bought in the [place].  She reports this helped for several days but then the symptoms returned.  Patient reports 2 days ago she developed severe back pain and fevers up to 102.  She was evaluated by her OB/GYN this morning (Friday) and given a prescription for Bactrim.  Urine culture was sent.  Patient reports she is taken to doses of the Bactrim however when her fever spiked this evening she presented to the emergency department.  She does have associated lower abdominal pain.  Patient reports symptoms are similar to previous episodes of UTI however seem worse.  She reports she has never had fever with her UTIs in the past.  No specific aggravating or alleviating factors.  No other treatments prior to arrival.  Patient denies headache, chest pain, shortness of breath, nausea, vomiting, diarrhea, weakness, dizziness, syncope. LMP: Last week  The history is provided by the patient, medical records and the spouse. No language interpreter was used.       Past Medical History:  Diagnosis Date  . GERD (gastroesophageal reflux disease)   . Left ovarian cyst     Patient Active Problem List   Diagnosis Date Noted  . Epistaxis 03/20/2018  . Allergic contact dermatitis due to plants 03/20/2018  . Viral URI 02/20/2017  . Allergic urticaria 02/20/2017  . Allergic reaction 03/09/2016  . Perennial and seasonal allergic rhinitis 03/09/2016  . Seasonal allergic conjunctivitis 03/09/2016    Past  Surgical History:  Procedure Laterality Date  . LAPAROSCOPIC OVARIAN CYSTECTOMY Right 12/29/2015   Procedure: LAPAROSCOPIC RIGHT OVARIAN CYSTECTOMY; CHROMOTUBATION; LYSIS OF ADHESIONS; EXCISION AND ABLATION OF ENDOMETRIOSIS;  Surgeon: Governor Specking, MD;  Location: [facility];  Service: Gynecology;  Laterality: Right;  . MICROTUBOPLASTY Bilateral March 2016  . TUBAL LIGATION  2009     OB History   No obstetric history on file.     Family History  Problem Relation Age of Onset  . Allergic rhinitis Mother   . Urticaria Mother   . Angioedema Neg Hx   . Asthma Neg Hx   . Atopy Neg Hx   . Eczema Neg Hx   . Immunodeficiency Neg Hx     Social History   Tobacco Use  . Smoking status: Never Smoker  . Smokeless tobacco: Never Used  Substance Use Topics  . Alcohol use: Yes    Alcohol/week: 0.0 standard drinks    Comment: occasional  . Drug use: No    Home Medications Prior to Admission medications   Medication Sig Start Date End Date Taking? Authorizing Provider  EPINEPHrine 0.3 mg/0.3 mL IJ SOAJ injection Inject 0.3 mLs (0.3 mg total) into the muscle once. 03/09/16  Yes Bobbitt, Sedalia Muta, MD  ibuprofen (ADVIL,MOTRIN) 800 MG tablet Take 1 tablet (800 mg total) by mouth every 8 (eight) hours as needed for moderate pain or cramping. 09/05/18  Yes Servando Salina, MD  levocetirizine (XYZAL) 5 MG tablet Take 1 tablet (5 mg total) by mouth every evening. 03/20/18  Yes Bobbitt, Sedalia Muta,  MD  loratadine (CLARITIN) 10 MG tablet Take 10 mg by mouth daily.   Yes [provider]  Multiple Vitamin (MULTIVITAMIN) tablet Take 1 tablet by mouth daily.   Yes [provider]  cephALEXin (KEFLEX) 500 MG capsule Take 1 capsule (500 mg total) by mouth 4 (four) times daily for 10 days. 08/01/20 08/11/20  Benito Lemmerman, Jarrett Soho, PA-C  phenazopyridine (PYRIDIUM) 200 MG tablet Take 1 tablet (200 mg total) by mouth 3 (three) times daily. 08/01/20   Jadriel Saxer,  Jarrett Soho, PA-C  potassium chloride (KLOR-CON) 10 MEQ tablet Take 1 tablet (10 mEq total) by mouth daily for 4 days. 08/01/20 08/05/20  Kolbie Lepkowski, Jarrett Soho, PA-C    Allergies    Amoxicillin  Review of Systems   Review of Systems  Constitutional: Negative for appetite change, diaphoresis, fatigue, fever and unexpected weight change.  HENT: Negative for mouth sores.   Eyes: Negative for visual disturbance.  Respiratory: Negative for cough, chest tightness, shortness of breath and wheezing.   Cardiovascular: Negative for chest pain.  Gastrointestinal: Positive for abdominal pain. Negative for constipation, diarrhea, nausea and vomiting.  Endocrine: Negative for polydipsia, polyphagia and polyuria.  Genitourinary: Positive for dysuria, flank pain, frequency, hematuria and urgency.  Musculoskeletal: Positive for back pain. Negative for neck stiffness.  Skin: Negative for rash.  Allergic/Immunologic: Negative for immunocompromised state.  Neurological: Negative for syncope, light-headedness and headaches.  Hematological: Does not bruise/bleed easily.  Psychiatric/Behavioral: Negative for sleep disturbance. The patient is not nervous/anxious.     Physical Exam Updated Vital Signs BP 110/83 (BP Location: Right Arm)   Pulse (!) 104   Temp 99.3 F (37.4 C) (Oral)   Resp 20   Ht 5\' 2"  (1.575 m)   Wt 102.5 kg   LMP 07/31/2020   SpO2 92%   BMI 41.34 kg/m   Physical Exam Vitals and nursing note reviewed.  Constitutional:      General: She is not in acute distress.    Appearance: She is not diaphoretic.  HENT:     Head: Normocephalic.  Eyes:     General: No scleral icterus.    Conjunctiva/sclera: Conjunctivae normal.  Cardiovascular:     Rate and Rhythm: Regular rhythm. Tachycardia present.     Pulses: Normal pulses.          Radial pulses are 2+ on the right side and 2+ on the left side.     Comments: Mild tachycardia Pulmonary:     Effort: No tachypnea, accessory muscle usage,  prolonged expiration, respiratory distress or retractions.     Breath sounds: No stridor.     Comments: Equal chest rise. No increased work of breathing. Abdominal:     General: There is no distension.     Palpations: Abdomen is soft.     Tenderness: There is abdominal tenderness in the suprapubic area. There is right CVA tenderness and left CVA tenderness. There is no guarding or rebound.     Comments: Very mild, bilateral CVA tenderness  Musculoskeletal:     Cervical back: Normal range of motion.     Comments: Moves all extremities equally and without difficulty.  Skin:    General: Skin is warm and dry.     Capillary Refill: Capillary refill takes less than 2 seconds.  Neurological:     Mental Status: She is alert.     GCS: GCS eye subscore is 4. GCS verbal subscore is 5. GCS motor subscore is 6.     Comments: Speech is clear and goal oriented.  Psychiatric:        Mood and Affect: Mood normal.     ED Results / Procedures / Treatments   Labs (all labs ordered are listed, but only abnormal results are displayed) Labs Reviewed  URINALYSIS, ROUTINE W REFLEX MICROSCOPIC - Abnormal; Notable for the following components:      Result Value   Color, Urine AMBER (*)    APPearance HAZY (*)    Hgb urine dipstick LARGE (*)    Ketones, ur 20 (*)    Protein, ur >=300 (*)    Leukocytes,Ua MODERATE (*)    RBC / HPF >50 (*)    WBC, UA >50 (*)    Non Squamous Epithelial 0-5 (*)    All other components within normal limits  COMPREHENSIVE METABOLIC PANEL - Abnormal; Notable for the following components:   Potassium 3.0 (*)    CO2 21 (*)    Glucose, Bld 132 (*)    Creatinine, Ser 1.09 (*)    Total Protein 8.3 (*)    All other components within normal limits  CBC WITH DIFFERENTIAL/PLATELET - Abnormal; Notable for the following components:   WBC 18.0 (*)    Hemoglobin 11.5 (*)    HCT 35.8 (*)    Neutro Abs 15.0 (*)    Monocytes Absolute 1.3 (*)    Abs Immature Granulocytes 0.25 (*)      All other components within normal limits  CULTURE, BLOOD (SINGLE)  URINE CULTURE  LACTIC ACID, PLASMA  PROTIME-INR  APTT  HCG, QUANTITATIVE, PREGNANCY  LACTIC ACID, PLASMA  I-STAT BETA HCG BLOOD, ED ([facility], [facility], [facility] ONLY)    EKG EKG Interpretation  Date/Time:  Saturday August 01 2020 05:25:21 EDT Ventricular Rate:  81 PR Interval:    QRS Duration: 89 QT Interval:  382 QTC Calculation: 444 R Axis:   28 Text Interpretation: Sinus rhythm Low voltage, precordial leads Borderline T wave abnormalities No previous ECGs available Confirmed by Ripley Fraise [phone]) on 08/01/2020 5:35:42 AM   Radiology DG Chest Port 1 View  Result Date: 08/01/2020 CLINICAL DATA:  Questionable sepsis.  Evaluate for abnormality. EXAM: PORTABLE CHEST 1 VIEW COMPARISON:  None. FINDINGS: Normal cardiomediastinal contours. No pleural effusion or edema. No airspace opacities. Platelike atelectasis is noted within both lung bases. IMPRESSION: Bibasilar atelectasis. Electronically Signed   By: Kerby Moors M.D.   On: 08/01/2020 05:26    Procedures Procedures (including critical care time)  Medications Ordered in ED Medications  lactated ringers infusion ( Intravenous New Bag/Given 08/01/20 0545)  phenazopyridine (PYRIDIUM) tablet 100 mg (100 mg Oral Given 08/01/20 0652)  cefTRIAXone (ROCEPHIN) 1 g in sodium chloride 0.9 % 100 mL IVPB (0 g Intravenous Stopped 08/01/20 0530)  HYDROcodone-acetaminophen (NORCO/VICODIN) 5-325 MG per tablet 2 tablet (2 tablets Oral Given 08/01/20 8185)  potassium chloride SA (KLOR-CON) CR tablet 40 mEq (40 mEq Oral Given 08/01/20 6314)    ED Course  I have reviewed the triage vital signs and the nursing notes.  Pertinent labs & imaging results that were available during my care of the patient were reviewed by me and considered in my medical decision making (see chart for details).  Clinical Course as of Aug 01 812  Sat Aug 01, 2020  9702 WNL   Lactic Acid, Venous: 0.8  [HM]  0558 Noted.  Most consistent with pyelonephritis.  WBC(!): 18.0 [HM]  0558 Unknown baseline.  Fluids given.  Creatinine(!): 1.09 [HM]  0558 Hypokalemia noted.  Oral repletion given.  Potassium(!): 3.0 [HM]  Clinical Course User Index [HM] Keeghan Bialy, Gwenlyn Perking   MDM Rules/Calculators/A&P                           Patient presents with symptoms of UTI.  Concern for possible pyelonephritis.  Tachycardia on arrival.  Urinalysis with evidence of urinary tract infection.  Urine culture sent.  Other labs pending.  Patient given Rocephin and fluids.  7:57 AM Patient reports she is feeling much better.  Labs are overall reassuring.  Lactic acid is within normal limits.  She does have a slightly elevated serum creatinine.  Fluids were given.  Additionally potassium was low with oral repletion.  Urinalysis with evidence of infection but also proteinuria.  Discussion with patient about need for follow-up with primary care for reassessment of creatinine and proteinuria.  Additionally discussed reasons to return immediately to the emergency department.  Vital signs of improved.  No evidence of sepsis.  Patient be discharged home with antibiotics.  BP 116/76 (BP Location: Right Arm)   Pulse 88   Temp 99.3 F (37.4 C) (Oral)   Resp 18   Ht 5\' 2"  (1.575 m)   Wt 102.5 kg   LMP 07/31/2020   SpO2 99%   BMI 41.34 kg/m     Final Clinical Impression(s) / ED Diagnoses Final diagnoses:  Pyelonephritis  Elevated serum creatinine  Hypokalemia  Proteinuria, unspecified type    Rx / DC Orders ED Discharge Orders         Ordered    cephALEXin (KEFLEX) 500 MG capsule  4 times daily        08/01/20 0808    phenazopyridine (PYRIDIUM) 200 MG tablet  3 times daily        08/01/20 0808    potassium chloride (KLOR-CON) 10 MEQ tablet  Daily        08/01/20 0808           Demitrius Crass, Jarrett Soho, PA-C 08/01/20 0813    Ripley Fraise, MD 08/01/20 2337

## 2020-08-01 NOTE — ED Notes (Signed)
BC x1 collected per order

## 2020-08-01 NOTE — ED Triage Notes (Signed)
Pt states she has already been diagnosed with a UTI but the culture is pending. PCP gave abx but fever is increasing. Pt took 800mg  ibuprofen pta.

## 2020-08-02 LAB — URINE CULTURE

## 2020-08-06 LAB — CULTURE, BLOOD (SINGLE)
Culture: NO GROWTH
Special Requests: ADEQUATE

## 2021-07-09 IMAGING — DX DG CHEST 1V PORT
1 series · 1 of 1 positions shown · non-contrast
Comparison: None.

CLINICAL DATA: Questionable sepsis.  Evaluate for abnormality.

EXAM:
PORTABLE CHEST 1 VIEW

[chest ap]
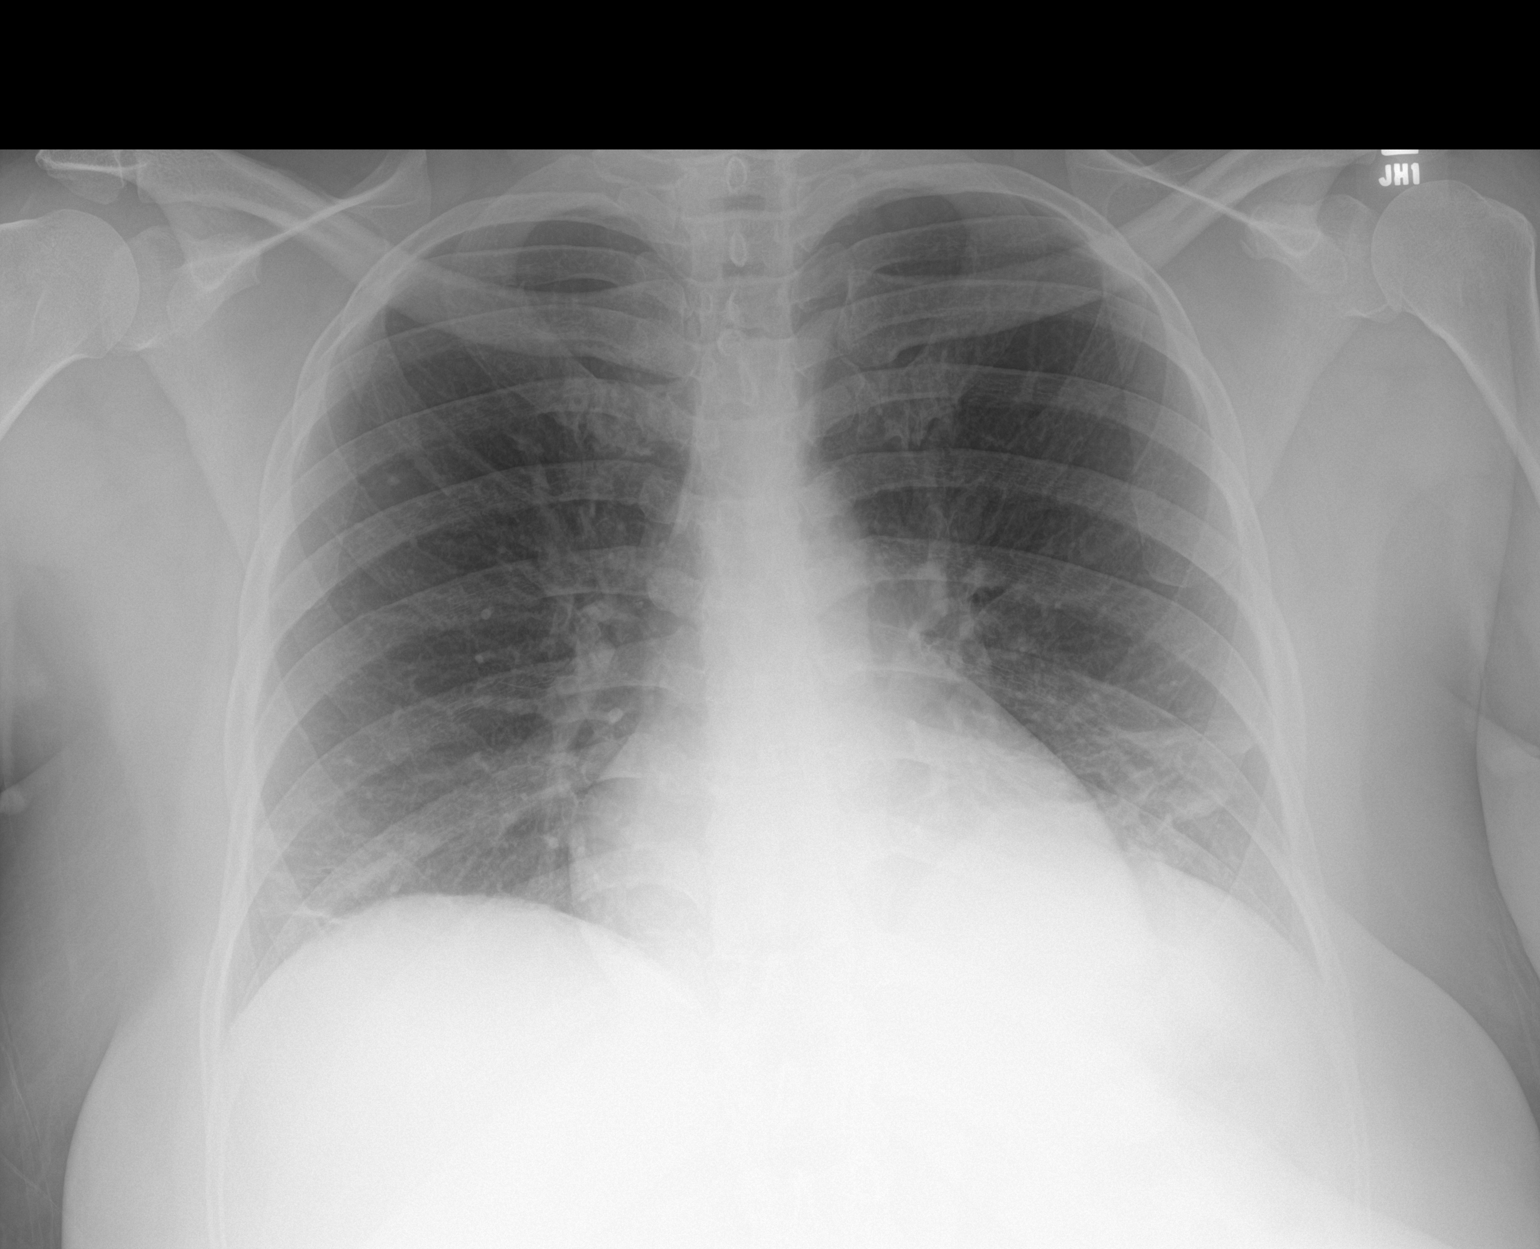

[1 of 1 positions shown; findings below may reference images not displayed]

FINDINGS: Normal cardiomediastinal contours. No pleural effusion or edema. No
airspace opacities. Platelike atelectasis is noted within both lung
bases.
IMPRESSION: Bibasilar atelectasis.

## 2023-08-28 ENCOUNTER — Emergency Department (HOSPITAL_BASED_OUTPATIENT_CLINIC_OR_DEPARTMENT_OTHER): Payer: BLUE CROSS/BLUE SHIELD

## 2023-08-28 ENCOUNTER — Other Ambulatory Visit: Payer: Self-pay

## 2023-08-28 ENCOUNTER — Other Ambulatory Visit (HOSPITAL_BASED_OUTPATIENT_CLINIC_OR_DEPARTMENT_OTHER): Payer: Self-pay

## 2023-08-28 ENCOUNTER — Encounter (HOSPITAL_BASED_OUTPATIENT_CLINIC_OR_DEPARTMENT_OTHER): Payer: Self-pay

## 2023-08-28 ENCOUNTER — Emergency Department (HOSPITAL_BASED_OUTPATIENT_CLINIC_OR_DEPARTMENT_OTHER)
Admission: EM | Admit: 2023-08-28 | Discharge: 2023-08-28 | Disposition: A | Discharge status: Home / Self Care | Payer: BLUE CROSS/BLUE SHIELD | Attending: Emergency Medicine | Admitting: Emergency Medicine

## 2023-08-28 DIAGNOSIS — D219 Benign neoplasm of connective and other soft tissue, unspecified: Secondary | ICD-10-CM

## 2023-08-28 DIAGNOSIS — R350 Frequency of micturition: Secondary | ICD-10-CM | POA: Insufficient documentation

## 2023-08-28 DIAGNOSIS — D259 Leiomyoma of uterus, unspecified: Secondary | ICD-10-CM | POA: Diagnosis not present

## 2023-08-28 DIAGNOSIS — Z79899 Other long term (current) drug therapy: Secondary | ICD-10-CM | POA: Insufficient documentation

## 2023-08-28 DIAGNOSIS — N939 Abnormal uterine and vaginal bleeding, unspecified: Secondary | ICD-10-CM | POA: Diagnosis present

## 2023-08-28 DIAGNOSIS — R9389 Abnormal findings on diagnostic imaging of other specified body structures: Secondary | ICD-10-CM | POA: Insufficient documentation

## 2023-08-28 DIAGNOSIS — N8500 Endometrial hyperplasia, unspecified: Secondary | ICD-10-CM | POA: Insufficient documentation

## 2023-08-28 LAB — CBC
HCT: 35.6 % — ABNORMAL LOW (ref 36.0–46.0)
Hemoglobin: 11.4 g/dL — ABNORMAL LOW (ref 12.0–15.0)
MCH: 28.4 pg (ref 26.0–34.0)
MCHC: 32 g/dL (ref 30.0–36.0)
MCV: 88.6 fL (ref 80.0–100.0)
Platelets: 267 10*3/uL (ref 150–400)
RBC: 4.02 MIL/uL (ref 3.87–5.11)
RDW: 13.8 % (ref 11.5–15.5)
WBC: 7.3 10*3/uL (ref 4.0–10.5)
nRBC: 0 % (ref 0.0–0.2)

## 2023-08-28 LAB — HCG, QUANTITATIVE, PREGNANCY: hCG, Beta Chain, Quant, S: <1 m[IU]/mL (ref <5)

## 2023-08-28 NOTE — Discharge Instructions (Signed)
Please follow-up with GYN in regards to recent symptoms and ER visit.  Today your labs are reassuring and your ultrasound shows that you have fibroids and a thickened endometrium that are most likely contributing to your abnormal uterine bleeding.  Please speak with your GYN about medications that may help with this or other therapies.  If symptoms change or worsen please return to ER.

## 2023-08-28 NOTE — ED Provider Notes (Signed)
[facility] EMERGENCY DEPARTMENT AT [facility] Provider Note   CSN: [id number] Arrival date & time: 08/28/23  1220     History  Chief Complaint  Patient presents with   Vaginal Bleeding    Amy Gross is a 47 y.o. female history of endometriosis presented vaginal bleeding for the past 4 weeks.  Patient recently was referred to an OB/GYN for urinary frequency and was diagnosed with overactive bladder but did not discuss the vaginal bleeding with OB/GYN.  Patient dates she is passing blood clots and feels fatigued with the vaginal bleeding but denies any history of anemia or LOC or syncope.  Patient denies abdominal pain, nausea/vomiting, dysuria.  Patient did give a urine sample at the gynecologist office that was negative for UTI and states she does not feel she is give a urine sample at this time as she is not concerned for UTI but wants to have an ultrasound to find out why she is having prolonged vaginal bleeding.  Patient denied chest pain, shortness of breath, fevers, change sensation of smell skills, flank pain, nausea/vomiting, blood thinners  Home Medications Prior to Admission medications   Medication Sig Start Date End Date Taking? Authorizing Provider  EPINEPHrine 0.3 mg/0.3 mL IJ SOAJ injection Inject 0.3 mLs (0.3 mg total) into the muscle once. 03/09/16   Bobbitt, Heywood Iles, MD  ibuprofen (ADVIL,MOTRIN) 800 MG tablet Take 1 tablet (800 mg total) by mouth every 8 (eight) hours as needed for moderate pain or cramping. 09/05/18   Maxie Better, MD  levocetirizine (XYZAL) 5 MG tablet Take 1 tablet (5 mg total) by mouth every evening. 03/20/18   Bobbitt, Heywood Iles, MD  loratadine (CLARITIN) 10 MG tablet Take 10 mg by mouth daily.    [provider]  Multiple Vitamin (MULTIVITAMIN) tablet Take 1 tablet by mouth daily.    [provider]  phenazopyridine (PYRIDIUM) 200 MG tablet Take 1 tablet (200 mg total) by mouth 3 (three) times daily. 08/01/20    Muthersbaugh, Dahlia Client, PA-C  potassium chloride (KLOR-CON) 10 MEQ tablet Take 1 tablet (10 mEq total) by mouth daily for 4 days. 08/01/20 08/05/20  Muthersbaugh, Dahlia Client, PA-C      Allergies    Amoxicillin    Review of Systems   Review of Systems  Genitourinary:  Positive for vaginal bleeding.    Physical Exam Updated Vital Signs BP (!) 131/107   Pulse (!) 55   Temp 98.3 F (36.8 C) (Oral)   Resp 16   SpO2 99%  Physical Exam Constitutional:      General: She is not in acute distress.    Comments: Resting comfortably on phone  Cardiovascular:     Rate and Rhythm: Normal rate and regular rhythm.     Pulses: Normal pulses.     Heart sounds: Normal heart sounds.  Pulmonary:     Effort: Pulmonary effort is normal. No respiratory distress.     Breath sounds: Normal breath sounds.  Abdominal:     General: There is no distension.     Palpations: Abdomen is soft.     Tenderness: There is no abdominal tenderness. There is no guarding or rebound.  Neurological:     Mental Status: She is alert.     ED Results / Procedures / Treatments   Labs (all labs ordered are listed, but only abnormal results are displayed) Labs Reviewed  CBC - Abnormal; Notable for the following components:      Result Value   Hemoglobin 11.4 (*)  HCT 35.6 (*)    All other components within normal limits  HCG, QUANTITATIVE, PREGNANCY    EKG None  Radiology US PELVIC COMPLETE W TRANSVAGINAL AND TORSION R/O  Result Date: 08/28/2023 CLINICAL DATA:  Vaginal bleeding for 4 weeks EXAM: TRANSABDOMINAL ULTRASOUND OF PELVIS DOPPLER ULTRASOUND OF OVARIES TECHNIQUE: Transabdominal ultrasound examination of the pelvis was performed including evaluation of the uterus, ovaries, adnexal regions, and pelvic cul-de-sac. Color and duplex Doppler ultrasound was utilized to evaluate blood flow to the ovaries. COMPARISON:  None Available. FINDINGS: Uterus Measurements: 7.3 x 5.9 x 5.5 cm = volume: 124.8 ML. Retroflexed.  Multiple fibroids. Intramural fibroid of the anterior right uterus measuring 1.1 x 0.9 x 1.3 cm. Intramural fibroid of the left uterine fundus measuring 1.2 x 1.2 x 1.0 cm. Intramural fibroid of the posterior uterine fundus with submucosal component measuring 1.6 x 1.6 x 2.0 cm. Subserosal fibroid of the anterior left uterus measuring 2.0 x 1.7 x 2.0 cm. Endometrium Thickness: 1.3 cm. Heterogeneous echotexture. No focal abnormality visualized. Right ovary Measurements: 3.7 x 2.2 x 2.3 cm = volume: 9.4 mL. Normal appearance/no adnexal mass. Dominant simple appearing follicle measuring up to 3.0 cm. Left ovary Measurements: 2.6 x 2.0 x 1.8 cm = volume: 4.8 mL. Normal appearance/no adnexal mass. Pulsed Doppler evaluation demonstrates normal low-resistance arterial and venous waveforms in both ovaries. Other: No free fluid seen in the pelvis. IMPRESSION: 1. Multi fibroid uterus. 2. Heterogeneous and thickened endometrium. Electronically Signed   By: Allegra Lai M.D.   On: 08/28/2023 17:20    Procedures Procedures    Medications Ordered in ED Medications - No data to display  ED Course/ Medical Decision Making/ A&P                                 Medical Decision Making Amount and/or Complexity of Data Reviewed Labs: ordered. Radiology: ordered.   Amy Gross 47 y.o. presented today for vaginal bleeding. Working DDx that I considered at this time includes, but not limited to, UTI, menstrual cycle, AUB, trauma, PID/TOA, endometrial CA, hypothyroidism, FB, STI, hemorrhagic cyst, ovarian torsion, miscarriage.  LMP: Irregular but states that her period began 4 weeks ago and is continued since then  R/o DDx: UTI, menstrual cycle, trauma, PID/TOA, endometrial CA, hypothyroidism, FB, STI, hemorrhagic cyst, ovarian torsion, miscarriage: These are considered less likely due to history of present illness, physical exam, labs/imaging findings  Review of prior external notes: 08/17/2023 initial  consult  Unique Tests and My Interpretation:  CBC: Unremarkable Serum hCG: Negative Pelvic ultrasound: Fibroids and thickened endometrium  Discussion with Independent Historian: None  Discussion of Management of Tests: None  Risk: Low: based on diagnostic testing/clinical impression and treatment plan  Risk Stratification Score: None  Plan: On exam patient was no acute distress stable vitals.  Patient feels exam was unremarkable however patient stated that when the pelvic ultrasound was conducted she felt bloated but denies any pain.  Patient denies blood thinners and CBC from triage does not show any anemia or white cell count.  Patient was not endorsing any infectious symptoms either or abdominal pain when I palpated and so abdominal labs were not ordered.  Pending patient's ultrasound may discharge with follow-up with her gynecologist.  Ultrasound does show fibroids and thickened endometrium.  The thickened endometrium would be consistent with patient's history of endometriosis however patient states that the fibroids are new.  I suspect both of these  are contribute to her abnormal uterine bleeding I recommend she follows up with her GYN.  Patient at this time is in agreements and is stable to be discharged.  Encourage patient to monitor symptoms as some change or worsen to return to ER.  Patient was given return precautions. Patient stable for discharge at this time.  Patient verbalized understanding of plan.  This chart was dictated using voice recognition software.  Despite best efforts to proofread,  errors can occur which can change the documentation meaning.         Final Clinical Impression(s) / ED Diagnoses Final diagnoses:  Fibroids  Thickened endometrium    Rx / DC Orders ED Discharge Orders     None         Remi Deter 08/28/23 1730    Ernie Avena, MD 08/29/23 1621

## 2023-08-28 NOTE — ED Notes (Signed)
Pt attempted to use the restroom to give urine sample but was unable to. Pt stated she used the restroom just before she came to the ED.

## 2023-08-28 NOTE — ED Triage Notes (Signed)
States been having her "period" for about 4 weeks.  Spotting and at time heavy

## 2024-01-23 NOTE — Discharge Summary (Signed)
 Hospitalist  Discharge Summary   Name: Kayla Roman Age: 48 yrs  MRN: [id number] DOB: 03-Feb-1976  Admit Date: 01/22/2024 Admitting Physician: Arlyne Edsel Rover, MD  Discharge Date: 01/23/2024 Discharge Physician: Christine Myron, MD   Admission Diagnosis:   Uterine fibroid [D25.9]   Discharge Diagnoses:   Active Problems:   Obesity due to excess calories   Sleep apnea in adult Resolved Problems:   * No resolved hospital problems. *   TO DO List at Follow-up for PCP/Specialist:   Key Medication changes: Robaxin, Hydrocodone , Bentyl for pain Pending labs to follow up on:  Incidental findings requiring follow-up:  Other:     Hospital Course:   For full details, please see H&P, progress notes, consult notes and ancillary notes   48 year old woman with past medical history of ovarian cyst, GERD, severe obesity was admitted following uterine artery embolization by IR on the morning of the 3/10.    She reported severe abdominal pain but no nausea.  She was switched from morphine PCA but without relief.  She was switched to Fentanyl  PCA. Pain management switched her to oral regimen and she wanted to go home by 5 PM on the day of discharge. Strict instructions were given to the patient about avoiding driving while on the pain meds and watch out for high risk of sleep apnea and she may end up in the ICU with too much pain medication.  Otherwise no visible bleeding.     ASSESSMENT AND PLAN Summary: 48 year old woman admitted following uterine artery embolization by IR on 3/10.  Admitted for pain control and monitoring.   # Uterine fibroids s/p uterine artery embolization on 3/10 - Other than pain she did not have comlications such as visible bleeding. Plan - Initially switched from morphine PCA to Fentanyl  PCA. - Pain medicine was consulted: -  PO hydrocodone -acetaminophen /Norco 5-325 mg po Q6 PRN for severe pain (primary team may give a 3-day Rx at discharge to wean off  fentanyl ) - Continue IV ketorolac  per primary team orders (transition to naproxen 250 mg po bid or 500 mg po daily per primary team preference at discharge) - Acetaminophen  650 mg po Q6 hours scheduled (dose decreased to keep total daily dose at 4 mg or less as Norco was started) - Begin PO Bentyl 10 mg QID for abdominal cramping - Bowel regimen  - Discharge medications including naproxen, Zofran  and senna have been ordered by IR.  Patient has an active prescription of Norco therefore no additional opioids will be needed for discharge.   High Risk of Sleep apnea She needed O2 when she slept  - Watch while on narcotics and sedative meds. Avoid driving. - PCP follow up requested for sleep study.  The patient's chronic medical conditions were treated accordingly per the patient's home medication regimen except as noted in the plan above and in the medication list below.    Discharge Condition:   Disposition: Patient discharged to Home or Self Care in fair condition.  Diet at discharge: Adult Diet- Regular  Activity at Discharge: Ambulate ad lib    Wound 01/22/24 Procedural puncture Thigh Anterior;Proximal;Right (Active)  Date First Assessed/Time First Assessed: 01/22/24 1235   Pre-Existing Wound: No  Primary Wound Type: Procedural puncture  Location: Thigh  Wound Location Orientation: Anterior;Proximal;Right  Wound Description (Comments): Sheath Site, R Groin     Physical Exam at Discharge   BP 109/60 (BP Location: Right arm, Patient Position: Lying)   Pulse 84   Temp 99.5 F (37.5 C) (  Oral)   Resp 16   SpO2 97%   AOx3  Obese, Moving all limbs  Lungs Clear CVS:S1S2 heard Abdomen: NABS,non distended,non tender,no masses MSK: No pedal edema    Discharge Medications:       Medication List     START taking these medications    acetaminophen  325 mg tablet Commonly known as: TYLENOL  Take 2 tablets (650 mg total) by mouth 4 (four) times a day for 10 days.    dicyclomine 10 mg capsule Commonly known as: BENTYL Take 1 capsule (10 mg total) by mouth 4 (four) times a day.   HYDROcodone -acetaminophen  5-325 mg per tablet Commonly known as: NORCO Take 1 tablet by mouth every 6 (six) hours as needed for severe pain (7-10).   methocarbamoL 500 mg tablet Commonly known as: ROBAXIN Take 0.5 tablets (250 mg total) by mouth every 6 (six) hours as needed for muscle spasms.   polyethylene glycol 17 gram packet Commonly known as: GLYCOLAX Take 17 g by mouth daily for 14 days. Start taking on: January 24, 2024   simethicone 80 mg chewable tablet Commonly known as: MYLICON Chew 1 tablet (80 mg total) every 6 (six) hours as needed for flatulence.       CONTINUE taking these medications    fexofenadine 180 mg tablet Commonly known as: ALLEGRA Take 180 mg by mouth daily as needed (allergies).   naproxen 500 mg tablet Commonly known as: NAPROSYN Take 1 tablet (500 mg total) by mouth in the morning and 1 tablet (500 mg total) in the evening. Take with meals. Do all this for 10 days.   ondansetron  4 mg disintegrating tablet Commonly known as: ZOFRAN -ODT Dissolve 1 tablet (4 mg total) on tongue every 8 (eight) hours as needed for nausea or vomiting.   senna 8.6 mg tablet Commonly known as: SENOKOT Take 1 tablet (8.6 mg total) by mouth 2 (two) times a day for 7 days.       STOP taking these medications    diphenhydrAMINE  25 mg capsule Commonly known as: BENADRYL    megestroL 40 mg tablet Commonly known as: MEGACE   mirabegron 25 mg Tb24 Commonly known as: Myrbetriq         Where to Get Your Medications     These medications were sent to [id number] - [place], [place] - [address] AT [facility] [address] & [facility] [address] - PHONE: [phone] - FAX: [phone]  [address], [place] [address]    Phone: [phone]  dicyclomine 10 mg capsule HYDROcodone -acetaminophen  5-325 mg per tablet methocarbamoL 500 mg  tablet ondansetron  4 mg disintegrating tablet polyethylene glycol 17 gram packet simethicone 80 mg chewable tablet    Information about where to get these medications is not yet available   Ask your nurse or doctor about these medications acetaminophen  325 mg tablet senna 8.6 mg tablet     Significant Diagnostic Tests:   LABS:  Lab Results  Component Value Date   WBC 14.50 (H) 01/23/2024   HGB 11.7 (L) 01/23/2024   HCT 36.3 01/23/2024   PLT 271 01/23/2024   CHOL 193 01/02/2024   TRIG 74 01/02/2024   HDL 52 (L) 01/02/2024   ALT 18 01/02/2024   AST 16 01/02/2024   NA 139 01/23/2024   K 3.9 01/23/2024   CL 106 01/23/2024   CREATININE 0.83 01/23/2024   BUN 15 01/23/2024   CO2 24 01/23/2024   TSH 0.822 01/02/2024   INR 0.9 01/16/2024  HGBA1C 6.2 (H) 01/02/2024   IMAGING:  IR Uterine Artery Embo  Final Result by Geni Wanda Sick, MD (03/10 1806)  IR UTERINE ARTERY EMBO, 01/22/2024 12:38 PM    INDICATION & HISTORY: symptomatic uterine fibroids and possible   adenomyosis, Leiomyoma of uterus, unspecified \ D25.9 Leiomyoma of uterus,   unspecified   symptomatic uterine fibroids and possible adenomyosis,    ATTENDING PHYSICIAN: Geni Sick, MD    RESIDENT PHYSICIAN: Burnetta Benne, MD    APP: None.    PROCEDURES:   1.  Local diagnostic ultrasound of the right femoral vessels.  2.  Diagnostic arteriography of the bilateral internal and uterine   arteries.  3.  Therapeutic bilateral uterine fibroid embolization.    FINDINGS:  1.  The right femoral vessels are patent.  2.  Diagnostic arteriography of both internal iliac arteries demonstrates   enlarged uterine arteries and multiple fibroids.   3.  Post-embolization arteriography demonstrates markedly diminished   antegrade blood flow and dense parenchymal staining of the fibroids.  4.  Right common femoral artery hemostasis with manual pressure per   patient preference.      IMPRESSION:  CONCLUSION:   Successful diagnostic pelvic angiography and uterine fibroid embolization   via bilateral uterine arteries.      PLAN:  The patient will be observed overnight. Plan to discharge in the morning.        DETAILS OF PROCEDURE:    Informed consent was obtained after a discussion of the risks, benefits,   and alternatives. The right groin was prepped and draped in the usual   sterile fashion. Maximum sterile barriers were used, including cap, mask,   hand hygiene, sterile gloves, sterile gown, large sterile drape, and 2%   chlorhexidine for cutaneous antisepsis. A standardized time-out was then   performed in accordance with the Universal Protocol guidelines to confirm   the correct patient, operative site, and procedure.    Ultrasound of the right groin was performed. A representative image was   stored. The skin overlying the femoral vessels was anesthetized with 1%   lidocaine . A micropuncture kit was used with ultrasound guidance to access   the right common femoral artery retrograde. A micropuncture kit was used   to upsize to a 0.035 Bentson guidewire. A 5 French short vascular sheath   was placed. A arteriogram of the right femoral artery was performed with   the side arm to confirm appropriate access site.    The aortic bifurcation was crossed using a 4 [place] Omni Flush catheter   and the Bentson guidewire. The Bentson guidewire was exchanged for a   Glidewire and manipulated into the external iliac artery. The catheter was   advanced, and the Glidewire was exchanged back for the Bentson guidewire.   The catheter was then removed. A 5 French RUC catheter was formed within   the distal abdominal aorta.    The left internal iliac artery was selected and and diagnostic angiography   was performed. The uterine artery was identified and catheterized using a   2.8 Progreat microcatheter and Fathom microwire. The uterine artery was   selected and arteriography was performed. The  left uterine artery was then   embolized with 0.5 vial 300 to 500 um and 2 vials 500 to 700 um   embospheres under fluoroscopic visualization. Post embolization   arteriography was performed. The microcatheter system was flushed and   removed.    The RUC catheter was then manipulated into the  right internal iliac   artery. Arteriography was performed. The uterine artery was identified and   catheterized using the same microcatheter system. The microcatheter was   advanced into the proximal uterine artery. Arteriography was performed.   The right uterine artery was then embolized using 0.5 vial 300 to 500 um   and one vial of 500 to 700 um embospheres under fluoroscopic   visualization.    All wires and catheters were removed. Pressure was applied until   hemostasis. The arteriotomy site was covered with a sterile dressing.    COMPLICATIONS: No immediate complications. The patient tolerated the   procedure well and left the interventional radiology suite in stable   condition.    ESTIMATED BLOOD LOSS: Less than 10 mL.    CONTRAST USED: 150 mL Visipaque.    FLUOROSCOPY TIME / CUMULATIVE DOSE: 40.7 minutes / 919 mGy.     SEDATION: Moderate sedation.    A pre-procedure assessment included review of the history & physical and   relevant diagnostic tests, ensuring that the patient was an appropriate   candidate for moderate sedation. An independent, trained nurse Kathye Rubens, RN) administered the IV medication required for sedation while   monitoring continuous vital signs and cardiorespiratory function. This RN   was present during the entire procedure under the supervision of the   attending physician, Dr. Theresia. The patient returned to baseline mental   status prior to transfer from the department. Clinical and physiologic   documentation from this procedure is available within the electronic   medical record. The patient's age, active monitoring times, and total    medication dosages are as follows:    *  Patient age: 70 years  *  Moderate sedation start time: 1016  *  Moderate sedation end time: 1219  *  Total intraservice face-to-face sedation time: 125 minutes    MEDICATIONS: Versed  3 mg IV. Fentanyl  125 mcg IV.     ANTIBIOTICS: See separate Nursing/Anesthesia documentation.        *Structured report code: IR.2.1     CULTURES:  No results found for this visit on 01/22/24 (from the past week). PATHOLOGY: Pending  Surgeries/Procedures:     Other procedures performed: Uterine artery embolization  Consults:   IP CONSULT TO PAIN MANAGEMENT   Follow-up Appointments:     Future Appointments  Date Time Provider Department Center  02/08/2024  2:00 PM Geni Wanda Theresia, MD [facility] IR CLN [facility] [place]  02/29/2024  1:30 PM Lacinda ONEIDA Staple, NP [facility] G[facility] [address]  01/01/2025  9:00 AM Elveria Tedi Kaiser, MD [facility] [place] [facility] [address]  01/10/2025 10:30 AM [facility] MG[facility] RAD [facility] [place]         Electronically signed by: Christine Myron, MD 01/23/2024 4:56 PM Time spent on discharge: 45 minutes.

## 2024-02-29 NOTE — Progress Notes (Signed)
 GI Clinic Telehealth Visit   Location Information: Patient State (at time of visit): [place]  Patient Location (at time of visit):Home/Other Non-Medical  Provider Location: [place]  Is provider licensed to provide clinical care in the current location/state of the patient? Yes   Consent:  Patient's identity was confirmed. Presenting condition or illness was discussed with the patient/personal representative. Current proposed treatment for presenting condition or illness was explained to patient/personal representative along with the likely benefits and any significant risks or complications associated with the provision of treatment by audio/video means. The patient/personal representative verbally authorized treatment to be provided by audio/video, which may include a limited review of patient's current health status, medication, or other treatment recommendations, patient education, and an opportunity to ask questions about condition and treatment. Verbal Consent Granted by Patient/Personal Representative:Yes   Visit Information: Modality: 2-Way Real-Time Audio/Video  Video Start Time: 1337 Video Stop Time: 1348 Video Total Time: 11 minutes     CC: Colon Cancer Screening    HPI: Kayla Roman is a 48 y.o.  female with a pMH of OSA, obesity,prediabetes  who is seen via Telehealth visit in consultation for evaluation of colon cancer screening. She denies a family history of colon cancer. Mother, aunt and uncles had colon polyps. She denies previous colonoscopy. She denies a change in bowels habits, diarrhea, constipation, abdominal pain or blood in her stools. She denies previous bowel surgery.     Labs:  Heme labs:  Lab Results  Component Value Date   WBC 14.50 (H) 01/23/2024   HGB 11.7 (L) 01/23/2024   HCT 36.3 01/23/2024   MCV 84.5 01/23/2024   PLT 271 01/23/2024      Liver labs:  Lab Results  Component Value Date   ALT 18 01/02/2024   AST 16 01/02/2024    BILITOT 0.4 01/02/2024     Lab Results  Component Value Date   INR 0.9 01/16/2024   PROTIME 9.9 01/16/2024      Misc labs:  Lab Results  Component Value Date   HGBA1C 6.2 (H) 01/02/2024      MOST RECENT STUDIES:  EGD -n/a  Colonoscopy -n/a     ROS: A complete review of systems was otherwise negative, except as noted in the HPI.     PMH:  Past Medical History:  Diagnosis Date  . Allergies   . Fibroids   . History of reversal of tubal ligation 2015  . Hx of tubal ligation 2009  . Urinary frequency       PSH:  Past Surgical History:  Procedure Laterality Date  . OVARIAN CYST REMOVAL    . TUBAL LIGATION        SOCIAL:  Social History   Socioeconomic History  . Marital status: Married    Spouse name: None  . Number of children: None  . Years of education: None  . Highest education level: None  Occupational History  . None  Tobacco Use  . Smoking status: Never  . Smokeless tobacco: Never  Vaping Use  . Vaping status: Never Used  Substance and Sexual Activity  . Alcohol use: Yes    Alcohol/week: 1.0 standard drink of alcohol    Types: 1 Drinks containing 0.5 oz of alcohol per week    Comment: occ  . Drug use: Never  . Sexual activity: Yes  Other Topics Concern  . None  Social History Narrative  . None   Social Drivers of Health   Food Insecurity: Low Risk  (01/22/2024)  Food vital sign   . Within the past 12 months, you worried that your food would run out before you got money to buy more: Never true   . Within the past 12 months, the food you bought just didn't last and you didn't have money to get more: Never true  Transportation Needs: No Transportation Needs (01/22/2024)   Transportation   . In the past 12 months, has lack of reliable transportation kept you from medical appointments, meetings, work or from getting things needed for daily living? : No  Safety: Low Risk  (01/22/2024)   Safety   . How often does anyone, including family and  friends, physically hurt you?: Never   . How often does anyone, including family and friends, insult or talk down to you?: Never   . How often does anyone, including family and friends, threaten you with harm?: Never   . How often does anyone, including family and friends, scream or curse at you?: Never  Living Situation: Low Risk  (01/22/2024)   Living Situation   . What is your living situation today?: I have a steady place to live   . Think about the place you live. Do you have problems with any of the following? Choose all that apply:: None/None on this list      FH: No GI or liver disease.  family history includes Kidney cancer in her maternal aunt.    ALLERGIES:  is allergic to amoxicillin and penicillin.    PHYSICAL EXAM (via video interpretation)  No Vital Signs available given this was a Telehealth visit.  General: Appears well. Well developed, well nourished, no acute distress.  Resting comfortably. Eyes: EOMI, no scleral icterus, no conjunctival injection  Respiratory:  Normal work of breathing, regular respiratory rate  Psych: pleasant affect, appropriate judgement; clarity of thought and speech    ASSESSMENT AND PLAN:  Encounter Diagnosis  Name Primary?  . Colon cancer screening Yes     48 y.o.  female with a pMH of OSA, obesity,prediabetes and a family history of colon polyps who is seen via Telehealth visit in consultation for evaluation of colon cancer screening. She denies a family history of colon cancer. She denies any symptoms referable to her GI tract. She denies blood in her stools. Will plan for colonoscopy. Risks, complications and benefits of colonoscopy were discussed with the patient and all questions were answered.    --Colonoscopy --Return visit PRN   Electronically signed by: Lacinda ONEIDA Staple, NP 02/29/2024 1:49 PM    I have personally spent 11 minutes involved in face-to-face and non-face-to-face activities for this patient on the day of the visit.   Professional time spent includes the following activities, in addition to those noted in the documentation: 22 minutes
